# Patient Record
Sex: Female | Born: 1950 | Race: White | Hispanic: No | Marital: Single | Smoking: Former smoker
Health system: Southern US, Community
[De-identification: ages and names within clinical notes are randomized; demographics above are authoritative.]

## PROBLEM LIST (undated history)

## (undated) DIAGNOSIS — F32A Depression, unspecified: Secondary | ICD-10-CM

## (undated) DIAGNOSIS — F329 Major depressive disorder, single episode, unspecified: Secondary | ICD-10-CM

## (undated) DIAGNOSIS — K219 Gastro-esophageal reflux disease without esophagitis: Secondary | ICD-10-CM

## (undated) DIAGNOSIS — L409 Psoriasis, unspecified: Secondary | ICD-10-CM

## (undated) DIAGNOSIS — F419 Anxiety disorder, unspecified: Secondary | ICD-10-CM

## (undated) DIAGNOSIS — I10 Essential (primary) hypertension: Secondary | ICD-10-CM

## (undated) DIAGNOSIS — D649 Anemia, unspecified: Secondary | ICD-10-CM

## (undated) DIAGNOSIS — E7800 Pure hypercholesterolemia, unspecified: Secondary | ICD-10-CM

## (undated) HISTORY — DX: Pure hypercholesterolemia, unspecified: E78.00

## (undated) HISTORY — DX: Major depressive disorder, single episode, unspecified: F32.9

## (undated) HISTORY — DX: Gastro-esophageal reflux disease without esophagitis: K21.9

## (undated) HISTORY — DX: Psoriasis, unspecified: L40.9

## (undated) HISTORY — DX: Essential (primary) hypertension: I10

## (undated) HISTORY — DX: Anemia, unspecified: D64.9

## (undated) HISTORY — DX: Anxiety disorder, unspecified: F41.9

## (undated) HISTORY — DX: Depression, unspecified: F32.A

---

## 1976-04-08 HISTORY — PX: TUBAL LIGATION: SHX77

## 1989-04-08 HISTORY — PX: TOTAL ABDOMINAL HYSTERECTOMY: SHX209

## 1998-08-28 ENCOUNTER — Other Ambulatory Visit: Admission: RE | Admit: 1998-08-28 | Discharge: 1998-08-28 | Payer: Self-pay | Admitting: *Deleted

## 1999-08-23 ENCOUNTER — Encounter: Admission: RE | Admit: 1999-08-23 | Discharge: 1999-08-23 | Payer: Self-pay | Admitting: Obstetrics and Gynecology

## 1999-08-23 ENCOUNTER — Encounter: Payer: Self-pay | Admitting: Obstetrics and Gynecology

## 1999-08-30 ENCOUNTER — Other Ambulatory Visit: Admission: RE | Admit: 1999-08-30 | Discharge: 1999-08-30 | Payer: Self-pay | Admitting: *Deleted

## 2000-08-25 ENCOUNTER — Encounter: Admission: RE | Admit: 2000-08-25 | Discharge: 2000-08-25 | Payer: Self-pay | Admitting: Obstetrics and Gynecology

## 2000-08-25 ENCOUNTER — Encounter: Payer: Self-pay | Admitting: Obstetrics and Gynecology

## 2001-10-14 ENCOUNTER — Encounter: Payer: Self-pay | Admitting: Obstetrics and Gynecology

## 2001-10-14 ENCOUNTER — Encounter: Admission: RE | Admit: 2001-10-14 | Discharge: 2001-10-14 | Payer: Self-pay | Admitting: Obstetrics and Gynecology

## 2003-05-09 ENCOUNTER — Encounter: Admission: RE | Admit: 2003-05-09 | Discharge: 2003-05-09 | Payer: Self-pay | Admitting: Obstetrics and Gynecology

## 2004-06-27 ENCOUNTER — Encounter: Admission: RE | Admit: 2004-06-27 | Discharge: 2004-06-27 | Payer: Self-pay | Admitting: Internal Medicine

## 2004-07-09 ENCOUNTER — Other Ambulatory Visit: Admission: RE | Admit: 2004-07-09 | Discharge: 2004-07-09 | Payer: Self-pay | Admitting: Internal Medicine

## 2004-08-15 ENCOUNTER — Encounter (INDEPENDENT_AMBULATORY_CARE_PROVIDER_SITE_OTHER): Payer: Self-pay | Admitting: Specialist

## 2004-08-15 ENCOUNTER — Ambulatory Visit (HOSPITAL_COMMUNITY): Admission: RE | Admit: 2004-08-15 | Discharge: 2004-08-15 | Payer: Self-pay | Admitting: *Deleted

## 2005-08-05 ENCOUNTER — Ambulatory Visit (HOSPITAL_COMMUNITY): Admission: RE | Admit: 2005-08-05 | Discharge: 2005-08-05 | Payer: Self-pay | Admitting: *Deleted

## 2005-10-03 ENCOUNTER — Encounter: Admission: RE | Admit: 2005-10-03 | Discharge: 2005-10-03 | Payer: Self-pay | Admitting: Internal Medicine

## 2006-01-16 ENCOUNTER — Emergency Department (HOSPITAL_COMMUNITY): Admission: EM | Admit: 2006-01-16 | Discharge: 2006-01-16 | Payer: Self-pay | Admitting: Emergency Medicine

## 2006-04-21 ENCOUNTER — Other Ambulatory Visit: Admission: RE | Admit: 2006-04-21 | Discharge: 2006-04-21 | Payer: Self-pay | Admitting: Obstetrics and Gynecology

## 2006-04-23 ENCOUNTER — Encounter: Admission: RE | Admit: 2006-04-23 | Discharge: 2006-04-23 | Payer: Self-pay | Admitting: Obstetrics and Gynecology

## 2006-11-04 ENCOUNTER — Encounter: Admission: RE | Admit: 2006-11-04 | Discharge: 2006-11-04 | Payer: Self-pay | Admitting: Obstetrics and Gynecology

## 2007-11-05 ENCOUNTER — Encounter: Admission: RE | Admit: 2007-11-05 | Discharge: 2007-11-05 | Payer: Self-pay | Admitting: Internal Medicine

## 2008-05-03 ENCOUNTER — Other Ambulatory Visit: Admission: RE | Admit: 2008-05-03 | Discharge: 2008-05-03 | Payer: Self-pay | Admitting: Obstetrics and Gynecology

## 2008-12-13 ENCOUNTER — Encounter: Admission: RE | Admit: 2008-12-13 | Discharge: 2008-12-13 | Payer: Self-pay | Admitting: Obstetrics and Gynecology

## 2009-05-24 ENCOUNTER — Other Ambulatory Visit: Admission: RE | Admit: 2009-05-24 | Discharge: 2009-05-24 | Payer: Self-pay | Admitting: Obstetrics and Gynecology

## 2010-04-29 ENCOUNTER — Encounter: Payer: Self-pay | Admitting: *Deleted

## 2010-06-15 ENCOUNTER — Other Ambulatory Visit: Payer: Self-pay | Admitting: Internal Medicine

## 2010-06-15 DIAGNOSIS — Z1231 Encounter for screening mammogram for malignant neoplasm of breast: Secondary | ICD-10-CM

## 2010-06-22 ENCOUNTER — Ambulatory Visit
Admission: RE | Admit: 2010-06-22 | Discharge: 2010-06-22 | Disposition: A | Discharge status: Home / Self Care | Payer: 59 | Source: Ambulatory Visit | Attending: Internal Medicine | Admitting: Internal Medicine

## 2010-06-22 DIAGNOSIS — Z1231 Encounter for screening mammogram for malignant neoplasm of breast: Secondary | ICD-10-CM

## 2010-08-24 NOTE — Op Note (Signed)
NAMELAASIA, ARCOS NO.:  [id number]   MEDICAL RECORD NO.:  [id number]          PATIENT TYPE:  AMB   LOCATION:  ENDO                         FACILITY:  [facility]   PHYSICIAN:  Georgiana Spinner, M.D.    DATE OF BIRTH:  [birth year]   DATE OF PROCEDURE:  08/15/2004  DATE OF DISCHARGE:                                 OPERATIVE REPORT   PROCEDURE:  Upper endoscopy with biopsy.   INDICATIONS:  GERD.  The patient states that her dysphagia and reflux have  improved on 2 weeks of PPI.   ANESTHESIA:  Demerol 60, Versed 7 mg.   PROCEDURE:  With patient mildly sedated in the left lateral decubitus  position, the Olympus videoscopic endoscope was inserted in the mouth,  passed under direct vision through the esophagus, which appeared normal,  until we reached the distal esophagus and there was an irregularity of the Z-  line that was photographed and biopsies of this area were taken to rule out  Barrett's esophagus.  We entered into the stomach.  Fundus, body, antrum,  duodenal bulb, second portion duodenum appeared normal.  From this point the  endoscope was slowly withdrawn, taking circumferential views of the duodenal  mucosa until the endoscope had been pulled back and the stomach placed in  retroflexion to review the stomach from below.  The endoscope was  straightened and withdrawn, taking circumferential views of the remaining  gastric and esophageal mucosa.  The patient's vital signs and pulse oximeter  remained stable.  The patient tolerated the procedure well with no apparent  complications.   FINDINGS:  Question of Barrett's esophagus, biopsied; await biopsy report.  Patient will call me for results and followup with me as an outpatient,  continue PPI therapy.      GMO/MEDQ  D:  08/15/2004  T:  08/15/2004  Job:  [id number]

## 2010-08-24 NOTE — Op Note (Signed)
NAMEDAUNE, DIVIRGILIO NO.:  [id number]   MEDICAL RECORD NO.:  [id number]          PATIENT TYPE:  AMB   LOCATION:  ENDO                         FACILITY:  [facility]   PHYSICIAN:  Georgiana Spinner, M.D.    DATE OF BIRTH:  1950/09/14   DATE OF PROCEDURE:  08/05/2005  DATE OF DISCHARGE:                                 OPERATIVE REPORT   PROCEDURE PERFORMED:  Colonoscopy.   INDICATIONS FOR PROCEDURE:  Colon cancer screening.   ANESTHESIA:  Fentanyl 100 mcg, Versed 10 mg.   ENDOSCOPIST:  Georgiana Spinner, M.D.   DESCRIPTION OF PROCEDURE:  With the patient mildly sedated in the left  lateral decubitus position, the Olympus video colonoscope was inserted into  the rectum and passed under direct vision to the cecum identified by  ileocecal valve and appendiceal orifice, both of which were photographed.  From this point the colonoscope was slowly withdrawn, taking circumferential  views of the colonic mucosa stopping only in the rectum which appeared  normal on direct and showed small internal hemorrhoids on retroflex view.  The endoscope was straightened and withdrawn. The patient's vital signs and  pulse oximeter remained stable.  The patient tolerated the procedure well  without apparent complications.   FINDINGS:  Internal hemorrhoids, rare diverticulum of sigmoid colon.  Otherwise unremarkable examination.   PLAN:  Repeat examination in 5 to 10 years.           ______________________________  Georgiana Spinner, M.D.     GMO/MEDQ  D:  08/05/2005  T:  08/05/2005  Job:  [id number]

## 2011-08-06 ENCOUNTER — Other Ambulatory Visit: Payer: Self-pay | Admitting: Obstetrics and Gynecology

## 2011-08-06 DIAGNOSIS — Z1231 Encounter for screening mammogram for malignant neoplasm of breast: Secondary | ICD-10-CM

## 2011-08-13 ENCOUNTER — Ambulatory Visit
Admission: RE | Admit: 2011-08-13 | Discharge: 2011-08-13 | Disposition: A | Discharge status: Home / Self Care | Payer: 59 | Source: Ambulatory Visit | Attending: Obstetrics and Gynecology | Admitting: Obstetrics and Gynecology

## 2011-08-13 DIAGNOSIS — Z1231 Encounter for screening mammogram for malignant neoplasm of breast: Secondary | ICD-10-CM

## 2012-05-09 HISTORY — PX: OTHER SURGICAL HISTORY: SHX169

## 2012-09-30 ENCOUNTER — Other Ambulatory Visit: Payer: Self-pay

## 2012-09-30 DIAGNOSIS — Z1231 Encounter for screening mammogram for malignant neoplasm of breast: Secondary | ICD-10-CM

## 2012-10-28 ENCOUNTER — Ambulatory Visit
Admission: RE | Admit: 2012-10-28 | Discharge: 2012-10-28 | Disposition: A | Discharge status: Home / Self Care | Payer: 59 | Source: Ambulatory Visit

## 2012-10-28 DIAGNOSIS — Z1231 Encounter for screening mammogram for malignant neoplasm of breast: Secondary | ICD-10-CM

## 2012-11-24 ENCOUNTER — Encounter: Payer: Self-pay | Admitting: Obstetrics and Gynecology

## 2012-11-25 ENCOUNTER — Ambulatory Visit: Payer: Self-pay | Admitting: Obstetrics and Gynecology

## 2012-11-25 ENCOUNTER — Telehealth: Payer: Self-pay | Admitting: Obstetrics and Gynecology

## 2012-11-25 NOTE — Telephone Encounter (Signed)
Patient canceled AEX appointment today with Dr. Tresa Res due to an illness. Patient rescheduled to 12/01/12 @ 1:30 with Dr.Romine.

## 2012-12-01 ENCOUNTER — Encounter: Payer: Self-pay | Admitting: Obstetrics and Gynecology

## 2012-12-01 ENCOUNTER — Ambulatory Visit (INDEPENDENT_AMBULATORY_CARE_PROVIDER_SITE_OTHER): Payer: 59 | Admitting: Obstetrics and Gynecology

## 2012-12-01 VITALS — BP 112/62 | HR 68 | Resp 16 | Ht 65.0 in | Wt 233.0 lb

## 2012-12-01 DIAGNOSIS — Z01419 Encounter for gynecological examination (general) (routine) without abnormal findings: Secondary | ICD-10-CM

## 2012-12-01 NOTE — Patient Instructions (Signed)

## 2012-12-01 NOTE — Progress Notes (Signed)
62 y.o.  Divorced   Caucasian   female   G1P1   here for annual exam.    Patient's last menstrual period was 04/08/1989.          Sexually active: no  The current method of family planning is tubal ligation, abstinence and status post hysterectomy.    Exercising: not now, getting ready to start Last mammogram:  10/28/12 normal Last pap smear:2010 neg History of abnormal pap: no Smoking: quit 14 years ago Alcohol: 3-5 glasses of wine a week Last colonoscopy:2006 normal, repeat in 10 years Last Bone Density:2010 normal   Last tetanus shot: 06/01/12 Last cholesterol check: 09/09/12  Elevated controlled with medication  Hgb:  pcp              Urine:pcp   Family History  Problem Relation Age of Onset  . Thyroid disease Mother   . Atrial fibrillation Mother   . Osteoarthritis Mother   . Heart disease Father   . Heart disease Brother   . Breast cancer Maternal Grandmother     There are no active problems to display for this patient.   Past Medical History  Diagnosis Date  . Hypertension   . Psoriasis   . Anxiety   . Depression   . GERD (gastroesophageal reflux disease)   . Elevated cholesterol   . Anemia     Past Surgical History  Procedure Laterality Date  . Total abdominal hysterectomy  1991    DUB-OV retained  . Tubal ligation  1978  . Finger injury Left 05/2012    fore finger cut opened(5 stitches)    Allergies: Penicillins  Current Outpatient Prescriptions  Medication Sig Dispense Refill  . aspirin 81 MG tablet Take 81 mg by mouth daily.      Marland Kitchen atorvastatin (LIPITOR) 40 MG tablet Take 40 mg by mouth daily.      . BUPROPION HCL PO Take 150 mg by mouth daily.      . Calcium Carbonate (CALCIUM 600 PO) Take by mouth 2 (two) times daily.      . citalopram (CELEXA) 20 MG tablet Take 20 mg by mouth daily.      Marland Kitchen EPIPEN 2-PAK 0.3 MG/0.3ML SOAJ injection       . lisinopril (PRINIVIL,ZESTRIL) 20 MG tablet Take 20 mg by mouth daily.      . pantoprazole (PROTONIX) 40 MG  tablet Take 40 mg by mouth daily.      . Multiple Vitamins-Minerals (ONE DAILY WOMENS 50+ PO) Take by mouth daily.       No current facility-administered medications for this visit.    ROS: Pertinent items are noted in HPI.  Social Hx:  Divorced, one daughter, retired, allergic to bee stings  Exam:    BP 112/62  Pulse 68  Resp 16  Ht 5\' 5"  (1.651 m)  Wt 233 lb (105.688 kg)  BMI 38.77 kg/m2  LMP 01/01/1991Ht and Wt stable from last yr   Wt Readings from Last 3 Encounters:  12/01/12 233 lb (105.688 kg)     Ht Readings from Last 3 Encounters:  12/01/12 5\' 5"  (1.651 m)    General appearance: alert, cooperative and appears stated age, diffuse patches of psoriasis over extrem and trunk Head: Normocephalic, without obvious abnormality, atraumatic Neck: no adenopathy, supple, symmetrical, trachea midline and thyroid not enlarged, symmetric, no tenderness/mass/nodules Lungs: clear to auscultation bilaterally Breasts: Inspection negative, No nipple retraction or dimpling, No nipple discharge or bleeding, No axillary or supraclavicular adenopathy, Normal  to palpation without dominant masses Heart: regular rate and rhythm Abdomen: soft, non-tender; bowel sounds normal; no masses,  no organomegaly Extremities: extremities normal, atraumatic, no cyanosis or edema Skin: Skin color, texture, turgor normal. No rashes or lesions Lymph nodes: Cervical, supraclavicular, and axillary nodes normal. No abnormal inguinal nodes palpated Neurologic: Grossly normal   Pelvic: External genitalia:  Diffusely erythematous c/w psoriasis              Urethra:  normal appearing urethra with no masses, tenderness or lesions              Bartholins and Skenes: normal                 Vagina: normal appearing vagina with normal color and discharge, no lesions              Cervix:absent              Pap taken: no        Bimanual Exam:  Uterus:  absent                                      Adnexa: normal  adnexa in size, nontender and no masses                                      Rectovaginal: Confirms                                      Anus:  normal sphincter tone, no lesions  A: normal menopausal exam, no HRT     S/p TAH 1991 for DUB, ovaries retained     Dep/anx;  GERD; elevated chol     Psoriasis, involves vulva and groin     P:     mammogram counseled on breast self exam, mammography screening, adequate intake of calcium and vitamin D, diet and exercise return annually or prn     An After Visit Summary was printed and given to the patient.

## 2013-10-15 ENCOUNTER — Other Ambulatory Visit: Payer: Self-pay

## 2013-10-15 DIAGNOSIS — Z1231 Encounter for screening mammogram for malignant neoplasm of breast: Secondary | ICD-10-CM

## 2013-10-29 ENCOUNTER — Ambulatory Visit
Admission: RE | Admit: 2013-10-29 | Discharge: 2013-10-29 | Disposition: A | Discharge status: Home / Self Care | Payer: 59 | Source: Ambulatory Visit

## 2013-10-29 ENCOUNTER — Encounter (INDEPENDENT_AMBULATORY_CARE_PROVIDER_SITE_OTHER): Payer: Self-pay

## 2013-10-29 DIAGNOSIS — Z1231 Encounter for screening mammogram for malignant neoplasm of breast: Secondary | ICD-10-CM

## 2013-12-02 ENCOUNTER — Ambulatory Visit: Payer: 59 | Admitting: Certified Nurse Midwife

## 2013-12-21 ENCOUNTER — Ambulatory Visit: Payer: 59 | Admitting: Certified Nurse Midwife

## 2014-02-07 ENCOUNTER — Encounter: Payer: Self-pay | Admitting: Obstetrics and Gynecology

## 2014-03-29 ENCOUNTER — Other Ambulatory Visit (HOSPITAL_COMMUNITY): Payer: Self-pay | Admitting: Internal Medicine

## 2014-03-29 ENCOUNTER — Ambulatory Visit (HOSPITAL_COMMUNITY): Payer: 59 | Attending: Internal Medicine | Admitting: Cardiology

## 2014-03-29 DIAGNOSIS — I4949 Other premature depolarization: Secondary | ICD-10-CM

## 2014-03-29 NOTE — Progress Notes (Signed)
Echo performed. 

## 2015-04-14 ENCOUNTER — Other Ambulatory Visit: Payer: Self-pay

## 2015-04-14 DIAGNOSIS — Z1231 Encounter for screening mammogram for malignant neoplasm of breast: Secondary | ICD-10-CM

## 2015-04-26 ENCOUNTER — Ambulatory Visit: Payer: Self-pay

## 2015-05-04 ENCOUNTER — Ambulatory Visit
Admission: RE | Admit: 2015-05-04 | Discharge: 2015-05-04 | Disposition: A | Discharge status: Home / Self Care | Payer: Commercial Managed Care - HMO | Source: Ambulatory Visit

## 2015-05-04 DIAGNOSIS — Z1231 Encounter for screening mammogram for malignant neoplasm of breast: Secondary | ICD-10-CM

## 2015-08-17 ENCOUNTER — Other Ambulatory Visit: Payer: Self-pay | Admitting: Gastroenterology

## 2016-05-02 ENCOUNTER — Other Ambulatory Visit: Payer: Self-pay | Admitting: Obstetrics & Gynecology

## 2016-05-02 DIAGNOSIS — Z1231 Encounter for screening mammogram for malignant neoplasm of breast: Secondary | ICD-10-CM

## 2016-05-30 ENCOUNTER — Ambulatory Visit
Admission: RE | Admit: 2016-05-30 | Discharge: 2016-05-30 | Disposition: A | Discharge status: Home / Self Care | Payer: Medicare Other | Source: Ambulatory Visit | Attending: Obstetrics & Gynecology | Admitting: Obstetrics & Gynecology

## 2016-05-30 DIAGNOSIS — Z1231 Encounter for screening mammogram for malignant neoplasm of breast: Secondary | ICD-10-CM

## 2016-09-04 ENCOUNTER — Other Ambulatory Visit: Payer: Self-pay | Admitting: Internal Medicine

## 2016-09-04 DIAGNOSIS — R3129 Other microscopic hematuria: Secondary | ICD-10-CM

## 2016-09-05 ENCOUNTER — Ambulatory Visit
Admission: RE | Admit: 2016-09-05 | Discharge: 2016-09-05 | Disposition: A | Discharge status: Home / Self Care | Payer: Medicare Other | Source: Ambulatory Visit | Attending: Internal Medicine | Admitting: Internal Medicine

## 2016-09-05 DIAGNOSIS — R3129 Other microscopic hematuria: Secondary | ICD-10-CM

## 2017-05-12 ENCOUNTER — Other Ambulatory Visit: Payer: Self-pay | Admitting: Internal Medicine

## 2017-05-12 ENCOUNTER — Ambulatory Visit
Admission: RE | Admit: 2017-05-12 | Discharge: 2017-05-12 | Disposition: A | Discharge status: Home / Self Care | Payer: Medicare Other | Source: Ambulatory Visit | Attending: Internal Medicine | Admitting: Internal Medicine

## 2017-05-12 DIAGNOSIS — R1030 Lower abdominal pain, unspecified: Secondary | ICD-10-CM

## 2017-06-04 ENCOUNTER — Other Ambulatory Visit: Payer: Self-pay | Admitting: Internal Medicine

## 2017-06-04 DIAGNOSIS — R1030 Lower abdominal pain, unspecified: Secondary | ICD-10-CM

## 2017-06-09 ENCOUNTER — Ambulatory Visit
Admission: RE | Admit: 2017-06-09 | Discharge: 2017-06-09 | Disposition: A | Discharge status: Home / Self Care | Payer: Medicare Other | Source: Ambulatory Visit | Attending: Internal Medicine | Admitting: Internal Medicine

## 2017-06-09 DIAGNOSIS — R1030 Lower abdominal pain, unspecified: Secondary | ICD-10-CM

## 2017-06-09 MED ORDER — IOPAMIDOL (ISOVUE-300) INJECTION 61%
125.0000 mL | Freq: Once | INTRAVENOUS | Status: AC | PRN
  Administered 2017-06-09: 125 mL via INTRAVENOUS

## 2017-06-10 ENCOUNTER — Other Ambulatory Visit: Payer: Self-pay | Admitting: Obstetrics & Gynecology

## 2017-06-10 DIAGNOSIS — Z1231 Encounter for screening mammogram for malignant neoplasm of breast: Secondary | ICD-10-CM

## 2017-06-18 ENCOUNTER — Other Ambulatory Visit: Payer: Medicare Other

## 2017-07-01 ENCOUNTER — Ambulatory Visit
Admission: RE | Admit: 2017-07-01 | Discharge: 2017-07-01 | Disposition: A | Discharge status: Home / Self Care | Payer: Medicare Other | Source: Ambulatory Visit | Attending: Obstetrics & Gynecology | Admitting: Obstetrics & Gynecology

## 2017-07-01 DIAGNOSIS — Z1231 Encounter for screening mammogram for malignant neoplasm of breast: Secondary | ICD-10-CM

## 2017-11-03 ENCOUNTER — Ambulatory Visit: Payer: Medicare Other | Admitting: Neurology

## 2017-11-03 ENCOUNTER — Encounter: Payer: Self-pay | Admitting: Neurology

## 2017-11-03 VITALS — BP 130/88 | HR 92 | Ht 65.0 in | Wt 222.0 lb

## 2017-11-03 DIAGNOSIS — G250 Essential tremor: Secondary | ICD-10-CM | POA: Diagnosis not present

## 2017-11-03 NOTE — Patient Instructions (Signed)
You have a mild tremor of both hands.   I do not see any signs or symptoms of parkinson's like disease or what we call parkinsonism.   For your tremor, I would not recommend any new medication for fear of side effects (especially sleepiness) or medication interactions, especially in light of your other medications and potential interactions.   We do not have to make a follow up appointment.   Please remember, that any kind of tremor may be exacerbated by anxiety, anger, nervousness, excitement, dehydration, sleep deprivation, by caffeine, and low blood sugar values or blood sugar fluctuations. Some medications can exacerbate tremors, this includes antidepressant medications, such as the celexa.    We may consider in the future a medication called Mysoline (primidone) in the future. Common side effects reported are: Sleepiness, drowsiness, balance problems, confusion, and GI related symptoms.

## 2017-11-03 NOTE — Progress Notes (Signed)
Subjective:    [name] is a 67 y.o. female.  HPI     Huston Foley, MD, PhD [facility] [address] [address] [place], [place] [address]  Dear Dr. Renne Crigler,  I saw your patient, Nicole Richmond, upon your kind request in my neurologic clinic today for initial consultation of her tremors. The patient is unaccompanied today. As you know, Nicole Richmond is a 67 year old right-handed woman with an underlying medical history of hypertension, reflux disease, diverticulitis, hyperlipidemia, depression, anxiety, psoriasis, and obesity, who reports a several year history of bilateral upper extremity tremors. In the past few months her tremor has increased. It has been mild and intermittent typically. She has not had any recent changes in her medication regimen. She has been on citalopram and Wellbutrin for years. She takes melatonin at night for sleep and has benefited from it as well. She has no family history of tremor. She denies any balance issues or falls. She does at times have difficulty concentrating and paying attention. She has never had a brain MRI. She tried propranolol in the past but noticed a flareup in her psoriasis and possible interaction with her [place], which has really worked well for her psoriasis. I reviewed your office note from 10/28/2017, which you kindly included. She quit smoking in 2000, she does not drink alcohol on a regular basis, drinks caffeine in the form of coffee, 2 cups per day on average, occasional tea. She is retired. She has 1 grown daughter. She is trying to lose weight.  Her Past Medical History Is Significant For: Past Medical History:  Diagnosis Date  . Anemia   . Anxiety   . Depression   . Elevated cholesterol   . GERD (gastroesophageal reflux disease)   . Hypertension   . Psoriasis     Her Past Surgical History Is Significant For: Past Surgical History:  Procedure Laterality Date  . finger injury  Left 05/2012   fore finger cut opened(5 stitches)  . TOTAL ABDOMINAL HYSTERECTOMY  1991   DUB-OV retained  . TUBAL LIGATION  1978    Her Family History Is Significant For: Family History  Problem Relation Age of Onset  . Thyroid disease Mother   . Atrial fibrillation Mother   . Osteoarthritis Mother   . Heart disease Father   . Heart disease Brother   . Breast cancer Paternal Grandmother        unsure if it was cancer    Her Social History Is Significant For: Social History   Socioeconomic History  . Marital status: Single    Spouse name: Not on file  . Number of children: Not on file  . Years of education: Not on file  . Highest education level: Not on file  Occupational History  . Not on file  Social Needs  . Financial resource strain: Not on file  . Food insecurity:    Worry: Not on file    Inability: Not on file  . Transportation needs:    Medical: Not on file    Non-medical: Not on file  Tobacco Use  . Smoking status: Former Smoker    Last attempt to quit: 12/02/1998    Years since quitting: 18.9  . Smokeless tobacco: Never Used  Substance and Sexual Activity  . Alcohol use: Yes    Alcohol/week: 1.8 - 3.0 oz    Types: 3 - 5 drink(s) per week    Comment: 3-5 glasses of wine a  week  . Drug use: No  . Sexual activity: Never    Partners: Male    Birth control/protection: Surgical    Comment: TAH  Lifestyle  . Physical activity:    Days per week: Not on file    Minutes per session: Not on file  . Stress: Not on file  Relationships  . Social connections:    Talks on phone: Not on file    Gets together: Not on file    Attends religious service: Not on file    Active member of club or organization: Not on file    Attends meetings of clubs or organizations: Not on file    Relationship status: Not on file  Other Topics Concern  . Not on file  Social History Narrative  . Not on file    Her Allergies Are:  Allergies  Allergen Reactions  . Penicillins    :   Her Current Medications Are:  Outpatient Encounter Medications as of 11/03/2017  Medication Sig  . Apremilast (OTEZLA) 30 MG TABS Take 30 mg by mouth 2 (two) times daily.  Marland Kitchen atorvastatin (LIPITOR) 40 MG tablet Take 40 mg by mouth daily.  . BUPROPION HCL PO Take 150 mg by mouth daily.  . Calcium Carb-Cholecalciferol (CALCIUM + D3 PO) Take by mouth.  . citalopram (CELEXA) 20 MG tablet Take 20 mg by mouth daily.  . Cyanocobalamin (B-12 PO) Take 1,000 mcg by mouth daily.  Marland Kitchen EPIPEN 2-PAK 0.3 MG/0.3ML SOAJ injection   . lisinopril (PRINIVIL,ZESTRIL) 20 MG tablet Take 20 mg by mouth daily.  . Melatonin 5 MG TABS Take 5 mg by mouth at bedtime as needed.  . Multiple Vitamins-Minerals (ONE DAILY WOMENS 50+ PO) Take by mouth daily.  . pantoprazole (PROTONIX) 40 MG tablet Take 40 mg by mouth daily.  . Probiotic Product (PROBIOTIC PO) Take by mouth.  . [DISCONTINUED] aspirin 81 MG tablet Take 81 mg by mouth daily.  . [DISCONTINUED] Calcium Carbonate (CALCIUM 600 PO) Take by mouth 2 (two) times daily.  . [DISCONTINUED] ranitidine (ZANTAC) 300 MG tablet Take 300 mg by mouth daily.   No facility-administered encounter medications on file as of 11/03/2017.   : Review of Systems:  Out of a complete 14 point review of systems, all are reviewed and negative with the exception of these symptoms as listed below:  Review of Systems  Neurological:       Pt presents today to discuss her tremors. Pt is right handed but notices the tremors in her left hand mostly.    Objective:  Neurological Exam  Physical Exam Physical Examination:   Vitals:   11/03/17 1319  BP: 130/88  Pulse: 92    General Examination: The patient is a very pleasant 67 y.o. female in no acute distress. She appears well-developed and well-nourished and well groomed.   HEENT: Normocephalic, atraumatic, pupils are equal, round and reactive to light and accommodation. Extraocular tracking is good without limitation to gaze  excursion or nystagmus noted. Normal smooth pursuit is noted. Hearing is grossly intact. Face is symmetric with normal facial animation and normal facial sensation. Speech is clear with no dysarthria noted. There is no hypophonia. There is no lip, neck/head, jaw or voice tremor. Neck is supple with full range of passive and active motion. There are no carotid bruits on auscultation. Oropharynx exam reveals: mild mouth dryness, adequate dental hygiene. Tongue protrudes centrally and palate elevates symmetrically.    Chest: Clear to auscultation without wheezing, rhonchi or crackles  noted.  Heart: S1+S2+0, regular and normal without murmurs, rubs or gallops noted.   Abdomen: Soft, non-tender and non-distended with normal bowel sounds appreciated on auscultation.  Extremities: There is no pitting edema in the distal lower extremities bilaterally.   Skin: Warm and dry without trophic changes noted.  Musculoskeletal: exam reveals no obvious joint deformities, tenderness or joint swelling or erythema.   Neurologically:  Mental status: The patient is awake, alert and oriented in all 4 spheres. Her immediate and remote memory, attention, language skills and fund of knowledge are appropriate. There is no evidence of aphasia, agnosia, apraxia or anomia. Speech is clear with normal prosody and enunciation. Thought process is linear. Mood is normal and affect is normal.  Cranial nerves II - XII are as described above under HEENT exam. In addition: shoulder shrug is normal with equal shoulder height noted. Motor exam: Normal bulk, strength and tone is noted. There is no drift, resting tremor or rebound.   On 11/03/2017: Archimedes spiral drawing she has minimal trembling with the left, slightly better with her dominant, right hand. And writing with her right hand and minimally tremulous, legible, not particularly micrographic. She has a very mild bilateral upper extremity postural and action tremor, no  intention tremor, no resting tremor.   Romberg is negative. Reflexes are 1+ throughout. Fine motor skills and coordination: intact with normal finger taps, normal hand movements, normal rapid alternating patting, normal foot taps and normal foot agility.  Cerebellar testing: No dysmetria or intention tremor on finger to nose testing. Heel to shin is unremarkable bilaterally. There is no truncal or gait ataxia.  Sensory exam: intact to light touch in the upper and lower extremities.  Gait, station and balance: She stands easily. No veering to one side is noted. No leaning to one side is noted. Posture is age-appropriate and stance is narrow based. Gait shows normal stride length and normal pace. No problems turning are noted. Tandem walk is unremarkable.   Assessment and Plan:   In summary, Nicole Richmond is a very pleasant 67 y.o.-year old female with an underlying medical history of hypertension, reflux disease, diverticulitis, hyperlipidemia, depression, anxiety, psoriasis, and obesity, who presents for evaluation of her tremors. On examination she has a mild bilateral upper chimney postural and action tremor. No signs of parkinsonism and she is reassured in that regard. Her history and exam are mostly in keeping with mild essential tremor despite absence of family history thereof. She does take an antidepressant of the SSRI kind which can in some instances cause a tremor or exacerbated. Nevertheless, she has benefited from her antidepressant and anxiety medication and there is no pressing need to change her regimen. Her neurological exam is otherwise benign, no pressing need for a brain scan at this moment. Overall, she is reassured. She tried propranolol in the past which made her psoriasis flareup unfortunately. For future consideration, she may be able to try Mysoline down the Road. For now, we mutually agreed to forego any symptomatic treatments. I will see her back on an as-needed basis. We  talked about tremor triggers today.  I answered all her questions today and she was in agreement.   Thank you very much for allowing me to participate in the care of this nice patient. If I can be of any further assistance to you please do not hesitate to call me at [phone].  Sincerely,   Huston FoleySaima Alera Quevedo, MD, PhD

## 2018-06-17 ENCOUNTER — Other Ambulatory Visit: Payer: Self-pay | Admitting: Obstetrics & Gynecology

## 2018-06-17 DIAGNOSIS — Z1231 Encounter for screening mammogram for malignant neoplasm of breast: Secondary | ICD-10-CM

## 2018-07-15 ENCOUNTER — Ambulatory Visit: Payer: Medicare Other

## 2018-09-02 ENCOUNTER — Ambulatory Visit
Admission: RE | Admit: 2018-09-02 | Discharge: 2018-09-02 | Disposition: A | Discharge status: Home / Self Care | Payer: Medicare Other | Source: Ambulatory Visit | Attending: Obstetrics & Gynecology | Admitting: Obstetrics & Gynecology

## 2018-09-02 ENCOUNTER — Other Ambulatory Visit: Payer: Self-pay

## 2018-09-02 DIAGNOSIS — Z1231 Encounter for screening mammogram for malignant neoplasm of breast: Secondary | ICD-10-CM

## 2019-08-09 ENCOUNTER — Other Ambulatory Visit: Payer: Self-pay | Admitting: Internal Medicine

## 2019-08-09 DIAGNOSIS — Z1231 Encounter for screening mammogram for malignant neoplasm of breast: Secondary | ICD-10-CM

## 2019-09-08 ENCOUNTER — Other Ambulatory Visit: Payer: Self-pay

## 2019-09-08 ENCOUNTER — Ambulatory Visit
Admission: RE | Admit: 2019-09-08 | Discharge: 2019-09-08 | Disposition: A | Discharge status: Home / Self Care | Payer: Medicare Other | Source: Ambulatory Visit | Attending: Internal Medicine | Admitting: Internal Medicine

## 2019-09-08 DIAGNOSIS — Z1231 Encounter for screening mammogram for malignant neoplasm of breast: Secondary | ICD-10-CM

## 2020-03-30 ENCOUNTER — Ambulatory Visit: Payer: Medicare Other | Attending: Critical Care Medicine

## 2020-03-30 ENCOUNTER — Other Ambulatory Visit: Payer: Self-pay

## 2020-03-30 DIAGNOSIS — Z23 Encounter for immunization: Secondary | ICD-10-CM

## 2020-03-30 NOTE — Progress Notes (Signed)
   Covid-19 Vaccination Clinic  Name:  Aziyah Provencal    MRN: [id number] DOB: 29-Mar-1951  03/30/2020  Ms. Leamy was observed post Covid-19 immunization for 15 minutes without incident. She was provided with Vaccine Information Sheet and instruction to access the V-Safe system.   Ms. Johal was instructed to call 911 with any severe reactions post vaccine: Marland Kitchen Difficulty breathing  . Swelling of face and throat  . A fast heartbeat  . A bad rash all over body  . Dizziness and weakness   Immunizations Administered    Name Date Dose VIS Date Route   Pfizer COVID-19 Vaccine 03/30/2020  3:11 PM 0.3 mL 01/26/2020 Intramuscular   Manufacturer: [employer], [employer]   Lot: [id number]   NDC: [address]-5

## 2020-08-14 ENCOUNTER — Other Ambulatory Visit: Payer: Self-pay | Admitting: Internal Medicine

## 2020-08-14 DIAGNOSIS — Z1231 Encounter for screening mammogram for malignant neoplasm of breast: Secondary | ICD-10-CM

## 2020-10-11 ENCOUNTER — Other Ambulatory Visit: Payer: Self-pay

## 2020-10-11 ENCOUNTER — Ambulatory Visit
Admission: RE | Admit: 2020-10-11 | Discharge: 2020-10-11 | Disposition: A | Discharge status: Home / Self Care | Payer: Medicare Other | Source: Ambulatory Visit | Attending: Internal Medicine | Admitting: Internal Medicine

## 2020-10-11 DIAGNOSIS — Z1231 Encounter for screening mammogram for malignant neoplasm of breast: Secondary | ICD-10-CM

## 2020-12-04 ENCOUNTER — Telehealth: Payer: Self-pay | Admitting: Neurology

## 2020-12-04 ENCOUNTER — Encounter: Payer: Self-pay | Admitting: Neurology

## 2020-12-04 ENCOUNTER — Other Ambulatory Visit: Payer: Self-pay

## 2020-12-04 ENCOUNTER — Ambulatory Visit: Payer: Medicare Other | Admitting: Neurology

## 2020-12-04 VITALS — Ht 65.0 in | Wt 219.2 lb

## 2020-12-04 DIAGNOSIS — R6889 Other general symptoms and signs: Secondary | ICD-10-CM | POA: Diagnosis not present

## 2020-12-04 DIAGNOSIS — H532 Diplopia: Secondary | ICD-10-CM

## 2020-12-04 NOTE — Telephone Encounter (Signed)
I sent referral for EEG to [facility] for scheduling.

## 2020-12-04 NOTE — Telephone Encounter (Signed)
[employer] medicare order sent to GI. NPR they will reach out to the patient to schedule.  

## 2020-12-04 NOTE — Progress Notes (Signed)
Subjective:    Patient ID: Breianna Delfino is a 70 y.o. female.  HPI   Star Age, MD, PhD [facility] [address] [place], [place] [address]  Dear Dr. Shelia Media,   I saw your patient, Shaelynn Dragos, upon your kind request in my neurologic clinic today for initial consultation of her double vision and spells of zoning out briefly. The patient is unaccompanied today. As you know, Ms. Probert is a 70 year old right-handed woman with an underlying medical history of anemia, hyperlipidemia, reflux disease, psoriasis, anxiety, depression, hypertension, and obesity, who reports 3 brief spells in the past 6 months of transient double vision and a feeling of zoning out, and emptiness in her head.  She has no history of headaches, she denies any headache or visual aura.  She has not had any spells lasting longer than a few seconds.  She estimates that these last for about 10 seconds at a time.  2 spells happen at home, no obvious trigger and 1 happened when she was driving on the highway which scared her.  She has taken 2 long distance trips by herself recently in the past few months.  Of note, she has not been sleeping well for the past year.  She has been on zaleplon.  She does not take it nightly.  She has not noticed an obvious connection to taking zaleplon.  She has been taking Advil at night and Tylenol during the day for chronic neck pain, she has had neck pain since approximately January of this year.  She is scheduled to see a rheumatologist as she also has psoriasis.  She reports bilateral hip pain and low back pain as well.  Her neck pain does not radiate to one shoulder or 1 arm.  She has not had any injuries or falls or sudden onset of one-sided symptoms such as numbness or tingling or droopy face or slurring of speech.  She has prescription eyeglasses but uses them only for driving.  She uses readers.   I reviewed your office note from 11/28/2020,  she reported 3 episodes of double vision and almost blacked out.  Blood pressure in your office was 120/82, heart rate 68.  She has been referred to ophthalmology. She has an appointment pending with ophthalmology as well as rheumatology.  She denies any chest pain or palpitations but does have shortness of breath with activity.  She has not seen a cardiologist.  She has not had a sleep study. She tries to hydrate well with water, estimates that she drinks at least 40 ounces of water per day.  She drinks caffeine in the form of coffee, half caff, about 1-1/2 cups/day.  She does not drink alcohol on a regular basis.  She lives alone.  She continues to have a hand tremor.  I have evaluated her for hand tremors in the past.  Addendum, 12/04/20 (10:34): I received blood test results from 11/28/2020: ESR mildly elevated at 36, CBC with differential from October 2021 benign, CMP also from October 2021 benign, no recent CMP.  Unless she had an updated CMP, we may have to draw an updated chemistry panel in preparation of her brain MRI.  Previously:   11/03/17: 70 year old right-handed woman with an underlying medical history of hypertension, reflux disease, diverticulitis, hyperlipidemia, depression, anxiety, psoriasis, and obesity, who reports a several year history of bilateral upper extremity tremors. In the past few months her tremor has increased. It has been mild and intermittent  typically. She has not had any recent changes in her medication regimen. She has been on citalopram and Wellbutrin for years. She takes melatonin at night for sleep and has benefited from it as well. She has no family history of tremor. She denies any balance issues or falls. She does at times have difficulty concentrating and paying attention. She has never had a brain MRI. She tried propranolol in the past but noticed a flareup in her psoriasis and possible interaction with her [place], which has really worked well for her psoriasis. I  reviewed your office note from 10/28/2017, which you kindly included. She quit smoking in 2000, she does not drink alcohol on a regular basis, drinks caffeine in the form of coffee, 2 cups per day on average, occasional tea. She is retired. She has 1 grown daughter. She is trying to lose weight. Her Past Medical History Is Significant For: Past Medical History:  Diagnosis Date   Anemia    Anxiety    Depression    Elevated cholesterol    GERD (gastroesophageal reflux disease)    Hypertension    Psoriasis     Her Past Surgical History Is Significant For: Past Surgical History:  Procedure Laterality Date   finger injury Left 05/2012   fore finger cut opened(5 stitches)   TOTAL ABDOMINAL HYSTERECTOMY  1991   DUB-OV retained   TUBAL LIGATION  1978    Her Family History Is Significant For: Family History  Problem Relation Age of Onset   Thyroid disease Mother    Atrial fibrillation Mother    Osteoarthritis Mother    Heart disease Father    Heart disease Brother    Breast cancer Paternal Grandmother        unsure if it was cancer    Her Social History Is Significant For: Social History   Socioeconomic History   Marital status: Single    Spouse name: Not on file   Number of children: Not on file   Years of education: Not on file   Highest education level: Not on file  Occupational History   Not on file  Tobacco Use   Smoking status: Former    Types: Cigarettes    Quit date: 12/02/1998    Years since quitting: 22.0   Smokeless tobacco: Never  Vaping Use   Vaping Use: Never used  Substance and Sexual Activity   Alcohol use: Yes    Alcohol/week: 1.0 standard drink    Types: 1 drink(s) per week    Comment: occ   Drug use: No   Sexual activity: Never    Partners: Male    Birth control/protection: Surgical    Comment: TAH  Other Topics Concern   Not on file  Social History Narrative   Not on file   Social Determinants of Health   Financial Resource Strain: Not  on file  Food Insecurity: Not on file  Transportation Needs: Not on file  Physical Activity: Not on file  Stress: Not on file  Social Connections: Not on file    Her Allergies Are:  Allergies  Allergen Reactions   Penicillins   :   Her Current Medications Are:  Outpatient Encounter Medications as of 12/04/2020  Medication Sig   Apremilast 30 MG TABS Take 30 mg by mouth 2 (two) times daily.   atorvastatin (LIPITOR) 40 MG tablet Take 40 mg by mouth daily.   BUPROPION HCL PO Take 150 mg by mouth daily.   Calcium Carb-Cholecalciferol (CALCIUM +  D3 PO) Take by mouth.   citalopram (CELEXA) 20 MG tablet Take 20 mg by mouth daily.   EPIPEN 2-PAK 0.3 MG/0.3ML SOAJ injection    LORATADINE ALLERGY RELIEF PO Take by mouth.   Melatonin 5 MG TABS Take 5 mg by mouth at bedtime as needed.   Multiple Vitamins-Minerals (ONE DAILY WOMENS 50+ PO) Take by mouth daily.   pantoprazole (PROTONIX) 40 MG tablet Take 40 mg by mouth daily.   Probiotic Product (PROBIOTIC PO) Take by mouth.   zaleplon (SONATA) 10 MG capsule Take 10 mg by mouth at bedtime as needed for sleep.   Cyanocobalamin (B-12 PO) Take 1,000 mcg by mouth daily.   lisinopril (PRINIVIL,ZESTRIL) 20 MG tablet Take 20 mg by mouth daily.   No facility-administered encounter medications on file as of 12/04/2020.  :   Review of Systems:  Out of a complete 14 point review of systems, all are reviewed and negative with the exception of these symptoms as listed below:  Review of Systems  Neurological:        Pt is here states that she is having double vision pt states she spaced out while she was driving . Pt stated she has had 3 episodes in the past 3 months . Pt states she has had severe neck pain since Jan. Pt states she is taking tylenol and advil daily    Objective:  Neurological Exam  Physical Exam Physical Examination:   There were no vitals filed for this visit.  See orthostatic vitals.  She did not have any obvious orthostatic  systolic or diastolic blood pressure drop, no orthostatic symptoms, no vertiginous symptoms.  General Examination: The patient is a very pleasant 70 y.o. female in no acute distress. She appears well-developed and well-nourished and well groomed.   HEENT: Normocephalic, atraumatic, pupils are equal, round and reactive to light and accommodation. Extraocular tracking is good without limitation to gaze excursion or nystagmus noted. Normal smooth pursuit is noted.  Funduscopic exam is somewhat limited due to bilateral cataracts.  She denies any diplopia.  Visual fields are full by finger perimetry. Hearing is grossly intact with tuning fork testing.  Face is symmetric with normal facial animation and normal facial sensation. Speech is clear with no dysarthria noted. There is no hypophonia. There is no lip, neck/head, jaw or voice tremor. Neck is supple with mild limitation to side to side turn, no crepitations noted. There are no carotid bruits on auscultation. Oropharynx exam reveals: mild mouth dryness, adequate dental hygiene. Tongue protrudes centrally and palate elevates symmetrically.     Chest: Clear to auscultation without wheezing, rhonchi or crackles noted.   Heart: S1+S2+0, regular and normal without murmurs, rubs or gallops noted.    Abdomen: Soft, non-tender and non-distended.   Extremities: There is no pitting edema in the distal lower extremities bilaterally.    Skin: Warm and dry without trophic changes noted.   Musculoskeletal: exam reveals no obvious joint deformities.    Neurologically:  Mental status: The patient is awake, alert and oriented in all 4 spheres. Her immediate and remote memory, attention, language skills and fund of knowledge are appropriate. There is no evidence of aphasia, agnosia, apraxia or anomia. Speech is clear with normal prosody and enunciation. Thought process is linear. Mood is normal and affect is normal.  Cranial nerves II - XII are as described above  under HEENT exam.  Motor exam: Normal bulk, strength and tone is noted. There is no drift, resting tremor or rebound.  She has a mild bilateral upper extremity postural and action tremor, no intention tremor, no resting tremor.    Romberg is negative. Reflexes are 1+ throughout, including ankles, toes are downgoing bilaterally. Fine motor skills and coordination: intact with normal finger taps, normal hand movements, normal rapid alternating patting, normal foot taps and normal foot agility.  Cerebellar testing: No dysmetria or intention tremor on finger to nose testing. Heel to shin is unremarkable bilaterally. There is no truncal or gait ataxia.  Sensory exam: intact to light touch in the upper and lower extremities.  Gait, station and balance: She stands easily. No veering to one side is noted. No leaning to one side is noted. Posture is age-appropriate and stance is narrow based. Gait shows normal stride length and normal pace. No problems turning are noted. Tandem walk is slow but doable, no corrective steps.      Assessment and Plan:    In summary, RONETTA MOLLA is a very pleasant 70 year old female with an underlying medical history of anemia, hyperlipidemia, reflux disease, psoriasis, anxiety, depression, hypertension, and obesity, who presents for evaluation of her brief episodes of diplopia and feeling of losing touch with reality briefly, lasting approximately 10 seconds at a time.  She has had 3 such episodes within the past 6 months.  On examination, she has a nonfocal neurological exam, stable upper extremity tremors.  No signs of parkinsonism.  History is not suggestive of TIA given the brief and stereotypic presentation.  History also not classic for seizure activity.  She is advised to seek consultation with rheumatology and ophthalmology as planned.  I would like to proceed with a brain MRI with and without contrast.  We will also proceed with an EEG for completion.  We will keep  her posted as to her results by phone call for now.  She is encouraged to talk to you about evaluation through cardiology.  Furthermore, she is advised to consider evaluation for sleep apnea but she is encouraged to discuss this with you first.  She reports that she has been able to sleep well until about a year ago when she started having trouble going to sleep and staying asleep.  She is furthermore advised to stop Sonata for now.  Medications such as benzodiazepine receptor agonists can cause daytime symptoms including grogginess, spells of decreased attentiveness, rarely also behaviors without recollection and brief episodes of amnesia.  She is advised to stay well-hydrated and well rested.  She is advised to refrain from long distance and highway driving for now.  She is advised to follow-up with you routinely and also discuss restarting Sonata if need be in the near future but for now I would recommend that she monitor her symptoms off of this medication.  I answered all her questions today and she was in agreement with the plan.   Thank you very much for allowing me to participate in the care of this nice patient. If I can be of any further assistance to you please do not hesitate to call me at [phone].   Sincerely,     Star Age, MD, PhD

## 2020-12-04 NOTE — Patient Instructions (Signed)
As discussed, your symptoms are not classic for a mini stroke so what we call TIA.  Nevertheless, I agree with further evaluation and from my end of things would like to proceed with a brain MRI with and without contrast as well as an EEG which is a brain electrical test.  We will call you to schedule these tests and keep you posted as to the results either through MyChart messaging or by phone call.  Your neurological exam looks benign today which is reassuring.  For your neck pain, I think it is a good idea to see a rheumatologist and you may benefit from seeing a spine specialist next.  If you have not seen a cardiologist, I recommend that you talk to your primary care about a referral to a cardiologist.  I think it is a good idea for you to see an ophthalmologist, you also have cataracts, these may need attention in the near future.  For now, I would recommend you do not drive long distance or on the highway.  Also, please refrain from taking the zaleplon for sleep, it can cause symptoms during the day including lapses in memory and spells of grogginess.  Please also talk to your primary care physician about potentially pursuing a sleep study as you do not sleep well at night.

## 2020-12-22 ENCOUNTER — Ambulatory Visit
Admission: RE | Admit: 2020-12-22 | Discharge: 2020-12-22 | Disposition: A | Discharge status: Home / Self Care | Payer: Medicare Other | Source: Ambulatory Visit | Attending: Neurology | Admitting: Neurology

## 2020-12-22 ENCOUNTER — Other Ambulatory Visit: Payer: Self-pay

## 2020-12-22 DIAGNOSIS — H532 Diplopia: Secondary | ICD-10-CM

## 2020-12-22 DIAGNOSIS — R6889 Other general symptoms and signs: Secondary | ICD-10-CM | POA: Diagnosis not present

## 2020-12-22 MED ORDER — GADOBENATE DIMEGLUMINE 529 MG/ML IV SOLN
20.0000 mL | Freq: Once | INTRAVENOUS | Status: AC | PRN
  Administered 2020-12-22: 20 mL via INTRAVENOUS

## 2020-12-26 ENCOUNTER — Telehealth: Payer: Self-pay | Admitting: *Deleted

## 2020-12-26 NOTE — Telephone Encounter (Signed)
Spoke with the patient and discussed the MRI brain results as noted in detail by Dr. Frances Furbish.  Patient understanding and her questions were answered.  At first she stated she would prefer not to have her EEG at [place]. To honor her wishes I advised we would look into other options as [facility] is not currently able to do the EEG. When I called the patient back to discuss alternative options she advised she changed her mind and she will proceed with having the EEG at [facility]. I clarified with patient with patient and she said she would have it there and she didn't know why she had been concerned. Her questions about MRI/EEG were answered and she verbalized appreciation for the call.

## 2020-12-26 NOTE — Telephone Encounter (Signed)
-----   Message from Huston Foley, MD sent at 12/25/2020 11:38 AM EDT ----- Please call patient regarding the recent brain MRI: The brain scan showed a normal structure of the brain and no significant volume loss (which we call atrophy). There were changes in the deeper structures of the brain, which we call white matter changes or microvascular changes. These were reported as moderately advanced in her case. These are white spots, that occur with time and are seen in a variety of conditions, including with normal aging, chronic hypertension, chronic headaches, especially migraine HAs, chronic diabetes, chronic hyperlipidemia. These are not strokes and no mass or lesion or contrast enhancement was seen which is reassuring. Again, there were no acute findings, such as a stroke, or mass or blood products. No further action is required on this test at this time, other than re-enforcing the importance of good blood pressure control, good cholesterol control, good blood sugar control, and weight management. Please remind patient to keep any upcoming appointments or tests and to call us with any interim questions, concerns, problems or updates.   I am not sure if she is scheduled for her EEG yet, can you look into it? Thanks,  Huston Foley, MD, PhD

## 2021-01-11 ENCOUNTER — Other Ambulatory Visit: Payer: Self-pay

## 2021-01-11 ENCOUNTER — Ambulatory Visit (HOSPITAL_COMMUNITY)
Admission: RE | Admit: 2021-01-11 | Discharge: 2021-01-11 | Disposition: A | Discharge status: Home / Self Care | Payer: Medicare Other | Source: Ambulatory Visit | Attending: Neurology | Admitting: Neurology

## 2021-01-11 DIAGNOSIS — H532 Diplopia: Secondary | ICD-10-CM | POA: Diagnosis not present

## 2021-01-11 DIAGNOSIS — R6889 Other general symptoms and signs: Secondary | ICD-10-CM | POA: Insufficient documentation

## 2021-01-11 DIAGNOSIS — R410 Disorientation, unspecified: Secondary | ICD-10-CM | POA: Diagnosis not present

## 2021-01-11 NOTE — Progress Notes (Signed)
EEG Completed; Results Pending  

## 2021-01-11 NOTE — Procedures (Signed)
  History:  79 yof with brief episode of diplopia and feeling of losing touch with reality   EEG classification: Normal awake and drowsy  Description of the recording: The background rhythms of this recording consists of a fairly well modulated medium amplitude alpha rhythm of 9 Hz that is reactive to eye opening and closure. As the record progresses, the patient appears to remain in the waking state throughout the recording. Photic stimulation was performed, did not show any abnormalities. Hyperventilation was also performed, did not show any abnormalities. Toward the end of the recording, the patient enters the drowsy state with slight symmetric slowing seen. The patient never enters stage II sleep. No abnormal epileptiform discharges seen during this recording. There was no focal slowing. EKG monitor shows no evidence of cardiac rhythm abnormalities with a heart rate of 66.  Impression: This is a normal EEG recording in the waking and drowsy state. No evidence of interictal epileptiform discharges seen. A normal EEG does not exclude a diagnosis of epilepsy.    Windell Norfolk, MD [facility]

## 2021-01-15 ENCOUNTER — Telehealth: Payer: Self-pay | Admitting: *Deleted

## 2021-01-15 NOTE — Telephone Encounter (Signed)
Spoke with patient and advised her of EEG results and recommendations as noted below by Dr Frances Furbish. Pt verbalized understanding and her questions were answered. Pt aware result will be sent over to Dr Renne Crigler as Dr Frances Furbish has recommended at this juncture that patient f/u with her PCP and other specialists as scheduled. She verbalized appreciation for the call.

## 2021-01-15 NOTE — Telephone Encounter (Signed)
-----   Message from Huston Foley, MD sent at 01/11/2021  2:12 PM EDT ----- Please call and advise the patient that the EEG or brain wave test we performed was reported as normal in the awake and drowsy states. We checked for abnormal electrical discharges in the brain waves and the report suggested normal findings. No further action is required on this test at this time.   At this juncture, I recommend that she follow-up with her primary care physician and other specialists as scheduled.  Thanks,  Huston Foley, MD, PhD

## 2021-04-19 ENCOUNTER — Other Ambulatory Visit: Payer: Self-pay | Admitting: Registered Nurse

## 2021-04-19 DIAGNOSIS — N6331 Unspecified lump in axillary tail of the right breast: Secondary | ICD-10-CM

## 2021-05-23 ENCOUNTER — Ambulatory Visit
Admission: RE | Admit: 2021-05-23 | Discharge: 2021-05-23 | Disposition: A | Discharge status: Home / Self Care | Payer: Medicare Other | Source: Ambulatory Visit | Attending: Registered Nurse | Admitting: Registered Nurse

## 2021-05-23 DIAGNOSIS — N6331 Unspecified lump in axillary tail of the right breast: Secondary | ICD-10-CM

## 2022-01-23 ENCOUNTER — Other Ambulatory Visit: Payer: Self-pay | Admitting: Internal Medicine

## 2022-01-23 DIAGNOSIS — Z1231 Encounter for screening mammogram for malignant neoplasm of breast: Secondary | ICD-10-CM

## 2022-03-14 ENCOUNTER — Ambulatory Visit
Admission: RE | Admit: 2022-03-14 | Discharge: 2022-03-14 | Disposition: A | Discharge status: Home / Self Care | Payer: Medicare Other | Source: Ambulatory Visit | Attending: Internal Medicine | Admitting: Internal Medicine

## 2022-03-14 DIAGNOSIS — Z1231 Encounter for screening mammogram for malignant neoplasm of breast: Secondary | ICD-10-CM

## 2022-04-11 ENCOUNTER — Other Ambulatory Visit (HOSPITAL_COMMUNITY): Payer: Self-pay | Admitting: Internal Medicine

## 2022-04-11 DIAGNOSIS — Z0000 Encounter for general adult medical examination without abnormal findings: Secondary | ICD-10-CM

## 2022-07-20 ENCOUNTER — Emergency Department (HOSPITAL_COMMUNITY): Payer: Medicare Other

## 2022-07-20 ENCOUNTER — Encounter (HOSPITAL_COMMUNITY): Payer: Self-pay

## 2022-07-20 ENCOUNTER — Other Ambulatory Visit: Payer: Self-pay

## 2022-07-20 ENCOUNTER — Observation Stay (HOSPITAL_COMMUNITY)
Admission: EM | Admit: 2022-07-20 | Discharge: 2022-07-21 | Disposition: A | Discharge status: Home / Self Care | Payer: Medicare Other | Attending: Internal Medicine | Admitting: Internal Medicine

## 2022-07-20 DIAGNOSIS — Z87891 Personal history of nicotine dependence: Secondary | ICD-10-CM | POA: Diagnosis not present

## 2022-07-20 DIAGNOSIS — F32A Depression, unspecified: Secondary | ICD-10-CM | POA: Diagnosis not present

## 2022-07-20 DIAGNOSIS — I160 Hypertensive urgency: Secondary | ICD-10-CM | POA: Diagnosis not present

## 2022-07-20 DIAGNOSIS — R0789 Other chest pain: Principal | ICD-10-CM | POA: Insufficient documentation

## 2022-07-20 DIAGNOSIS — E669 Obesity, unspecified: Secondary | ICD-10-CM | POA: Insufficient documentation

## 2022-07-20 DIAGNOSIS — I10 Essential (primary) hypertension: Secondary | ICD-10-CM | POA: Diagnosis not present

## 2022-07-20 DIAGNOSIS — F419 Anxiety disorder, unspecified: Secondary | ICD-10-CM | POA: Diagnosis not present

## 2022-07-20 DIAGNOSIS — L409 Psoriasis, unspecified: Secondary | ICD-10-CM | POA: Insufficient documentation

## 2022-07-20 DIAGNOSIS — Z6833 Body mass index (BMI) 33.0-33.9, adult: Secondary | ICD-10-CM | POA: Diagnosis not present

## 2022-07-20 DIAGNOSIS — I209 Angina pectoris, unspecified: Secondary | ICD-10-CM

## 2022-07-20 DIAGNOSIS — Z79899 Other long term (current) drug therapy: Secondary | ICD-10-CM | POA: Diagnosis not present

## 2022-07-20 DIAGNOSIS — K219 Gastro-esophageal reflux disease without esophagitis: Secondary | ICD-10-CM | POA: Insufficient documentation

## 2022-07-20 DIAGNOSIS — R079 Chest pain, unspecified: Secondary | ICD-10-CM | POA: Diagnosis present

## 2022-07-20 DIAGNOSIS — E785 Hyperlipidemia, unspecified: Secondary | ICD-10-CM | POA: Insufficient documentation

## 2022-07-20 LAB — COMPREHENSIVE METABOLIC PANEL
ALT: 22 U/L (ref 0–44)
AST: 22 U/L (ref 15–41)
Albumin: 3.8 g/dL (ref 3.5–5.0)
Alkaline Phosphatase: 117 U/L (ref 38–126)
Anion gap: 10 (ref 5–15)
BUN: 11 mg/dL (ref 8–23)
CO2: 25 mmol/L (ref 22–32)
Calcium: 9.4 mg/dL (ref 8.9–10.3)
Chloride: 102 mmol/L (ref 98–111)
Creatinine, Ser: 0.81 mg/dL (ref 0.44–1.00)
GFR, Estimated: >60 mL/min (ref >60)
Glucose, Bld: 97 mg/dL (ref 70–99)
Potassium: 4 mmol/L (ref 3.5–5.1)
Sodium: 137 mmol/L (ref 135–145)
Total Bilirubin: 1 mg/dL (ref 0.3–1.2)
Total Protein: 7.5 g/dL (ref 6.5–8.1)

## 2022-07-20 LAB — TROPONIN I (HIGH SENSITIVITY)
Troponin I (High Sensitivity): 4 ng/L (ref <18)
Troponin I (High Sensitivity): 5 ng/L (ref <18)
Troponin I (High Sensitivity): 4 ng/L (ref <18)

## 2022-07-20 LAB — CBC WITH DIFFERENTIAL/PLATELET
Abs Immature Granulocytes: 0.03 10*3/uL (ref 0.00–0.07)
Basophils Absolute: 0.1 10*3/uL (ref 0.0–0.1)
Basophils Relative: 1 %
Eosinophils Absolute: 0.2 10*3/uL (ref 0.0–0.5)
Eosinophils Relative: 2 %
HCT: 39.9 % (ref 36.0–46.0)
Hemoglobin: 12.3 g/dL (ref 12.0–15.0)
Immature Granulocytes: 0 %
Lymphocytes Relative: 20 %
Lymphs Abs: 2.2 10*3/uL (ref 0.7–4.0)
MCH: 26.8 pg (ref 26.0–34.0)
MCHC: 30.8 g/dL (ref 30.0–36.0)
MCV: 86.9 fL (ref 80.0–100.0)
Monocytes Absolute: 1.1 10*3/uL — ABNORMAL HIGH (ref 0.1–1.0)
Monocytes Relative: 10 %
Neutro Abs: 7.3 10*3/uL (ref 1.7–7.7)
Neutrophils Relative %: 67 %
Platelets: 346 10*3/uL (ref 150–400)
RBC: 4.59 MIL/uL (ref 3.87–5.11)
RDW: 13.6 % (ref 11.5–15.5)
WBC: 10.9 10*3/uL — ABNORMAL HIGH (ref 4.0–10.5)
nRBC: 0 % (ref 0.0–0.2)

## 2022-07-20 LAB — D-DIMER, QUANTITATIVE: D-Dimer, Quant: <0.27 ug/mL-FEU (ref 0.00–0.50)

## 2022-07-20 LAB — PROTIME-INR
INR: 1 (ref 0.8–1.2)
Prothrombin Time: 13.4 seconds (ref 11.4–15.2)

## 2022-07-20 MED ORDER — MELATONIN 5 MG PO TABS: 5.0000 mg | ORAL_TABLET | Freq: Every evening | ORAL | Status: DC | PRN

## 2022-07-20 MED ORDER — PANTOPRAZOLE SODIUM 40 MG PO TBEC
40.0000 mg | DELAYED_RELEASE_TABLET | Freq: Every day | ORAL | Status: DC
  Administered 2022-07-21: 40 mg via ORAL
  Filled 2022-07-20: qty 1

## 2022-07-20 MED ORDER — NITROGLYCERIN 0.4 MG SL SUBL: 0.4000 mg | SUBLINGUAL_TABLET | SUBLINGUAL | Status: DC | PRN

## 2022-07-20 MED ORDER — ATORVASTATIN CALCIUM 40 MG PO TABS
40.0000 mg | ORAL_TABLET | Freq: Every day | ORAL | Status: DC
  Administered 2022-07-21: 40 mg via ORAL
  Filled 2022-07-20: qty 1

## 2022-07-20 MED ORDER — ACETAMINOPHEN 325 MG PO TABS: 650.0000 mg | ORAL_TABLET | ORAL | Status: DC | PRN

## 2022-07-20 MED ORDER — CITALOPRAM HYDROBROMIDE 10 MG PO TABS
20.0000 mg | ORAL_TABLET | Freq: Every day | ORAL | Status: DC
  Administered 2022-07-21: 20 mg via ORAL
  Filled 2022-07-20: qty 2

## 2022-07-20 MED ORDER — BUPROPION HCL ER (XL) 150 MG PO TB24
150.0000 mg | ORAL_TABLET | Freq: Every day | ORAL | Status: DC
  Administered 2022-07-21: 150 mg via ORAL
  Filled 2022-07-20: qty 1

## 2022-07-20 MED ORDER — ONDANSETRON HCL 4 MG/2ML IJ SOLN: 4.0000 mg | Freq: Four times a day (QID) | INTRAMUSCULAR | Status: DC | PRN

## 2022-07-20 MED ORDER — AMLODIPINE BESYLATE 10 MG PO TABS
10.0000 mg | ORAL_TABLET | Freq: Every day | ORAL | Status: DC
  Administered 2022-07-21: 10 mg via ORAL
  Filled 2022-07-20: qty 1

## 2022-07-20 MED ORDER — HEPARIN (PORCINE) 25000 UT/250ML-% IV SOLN
1050.0000 [IU]/h | INTRAVENOUS | Status: DC
  Administered 2022-07-20: 900 [IU]/h via INTRAVENOUS
  Filled 2022-07-20: qty 250

## 2022-07-20 MED ORDER — APREMILAST 30 MG PO TABS: 30.0000 mg | ORAL_TABLET | Freq: Two times a day (BID) | ORAL | Status: DC

## 2022-07-20 MED ORDER — HEPARIN BOLUS VIA INFUSION
4000.0000 [IU] | Freq: Once | INTRAVENOUS | Status: AC
  Administered 2022-07-20: 4000 [IU] via INTRAVENOUS
  Filled 2022-07-20: qty 4000

## 2022-07-20 MED ORDER — NITROGLYCERIN 0.4 MG SL SUBL: 0.4000 mg | SUBLINGUAL_TABLET | Freq: Once | SUBLINGUAL | Status: DC

## 2022-07-20 MED ORDER — ZOLPIDEM TARTRATE 5 MG PO TABS: 5.0000 mg | ORAL_TABLET | Freq: Every evening | ORAL | Status: DC | PRN

## 2022-07-20 NOTE — H&P (Addendum)
History and Physical   Nicole Richmond SLH:[id number] DOB: 1950/10/06 DOA: 07/20/2022  PCP: Merri Brunette, MD   Patient coming from: Home  Chief Complaint: Chest pain   HPI: Nicole Richmond is a 72 y.o. female with medical history significant of anemia, anxiety, depression, GERD, hypertension, hyperlipidemia, psoriasis presenting with chest pain.  Patient awoked from sleep with severe chest pain/pressure radiation to her neck.  Reports pain for several hours.  Denies any associated nausea, diaphoresis, shortness of breath.  Does report a dark stool week ago which she attributes to taking ibuprofen for pain.  Called EMS and pain improved somewhat with aspirin and route.  2 out of 10 in the ED. Now increased to 8-9/10 on my exam  Denies fevers, chills, abdominal pain, constipation, diarrhea, nausea, vomiting.  Reports family history significant for both her brother and father having heart attacks at less than 74 years of age.  ED Course: Vital signs in the ED notable for blood pressure in the 90s to 120s systolic.  Lab workup included CMP within normal limits.  CBC with mild leukocytosis to 10.9.  PT, PTT, INR within normal limits.  Troponin negative x 2.  D-dimer normal.  FOBT pending.  Chest x-ray showed no acute normality.  Cardiology consulted and recommended prophylactic heparin drip and echocardiogram with monitoring overnight, will see the patient.  Review of Systems: As per HPI otherwise all other systems reviewed and are negative.  Past Medical History:  Diagnosis Date   Anemia    Anxiety    Depression    Elevated cholesterol    GERD (gastroesophageal reflux disease)    Hypertension    Psoriasis     Past Surgical History:  Procedure Laterality Date   finger injury Left 05/2012   fore finger cut opened(5 stitches)   TOTAL ABDOMINAL HYSTERECTOMY  1991   DUB-OV retained   TUBAL LIGATION  1978    Social History  reports that she quit smoking about 23 years ago.  Her smoking use included cigarettes. She has never used smokeless tobacco. She reports current alcohol use of about 1.0 standard drink of alcohol per week. She reports that she does not use drugs.  Allergies  Allergen Reactions   Penicillins     Family History  Problem Relation Age of Onset   Thyroid disease Mother    Atrial fibrillation Mother    Osteoarthritis Mother    Heart disease Father    Heart disease Brother    Breast cancer Paternal Grandmother        unsure if it was cancer  Reviewed on admission  Prior to Admission medications   Medication Sig Start Date End Date Taking? Authorizing Provider  Apremilast 30 MG TABS Take 30 mg by mouth 2 (two) times daily.    [provider]  atorvastatin (LIPITOR) 40 MG tablet Take 40 mg by mouth daily.    [provider]  BUPROPION HCL PO Take 150 mg by mouth daily.    [provider]  Calcium Carb-Cholecalciferol (CALCIUM + D3 PO) Take by mouth.    [provider]  citalopram (CELEXA) 20 MG tablet Take 20 mg by mouth daily.    [provider]  Cyanocobalamin (B-12 PO) Take 1,000 mcg by mouth daily.    [provider]  EPIPEN 2-PAK 0.3 MG/0.3ML SOAJ injection  09/17/12   [provider]  lisinopril (PRINIVIL,ZESTRIL) 20 MG tablet Take 20 mg by mouth daily.    [provider]  LORATADINE  ALLERGY RELIEF PO Take by mouth.    [provider]  Melatonin 5 MG TABS Take 5 mg by mouth at bedtime as needed.    [provider]  Multiple Vitamins-Minerals (ONE DAILY WOMENS 50+ PO) Take by mouth daily.    [provider]  pantoprazole (PROTONIX) 40 MG tablet Take 40 mg by mouth daily.    [provider]  Probiotic Product (PROBIOTIC PO) Take by mouth.    [provider]  zaleplon (SONATA) 10 MG capsule Take 10 mg by mouth at bedtime as needed for sleep.    [provider]    Physical Exam: Vitals:   07/20/22 1800 07/20/22  1812 07/20/22 1845 07/20/22 1848  BP:   134/79   Pulse: 85 87 83   Resp: Temp:    98.3 F (36.8 C)  TempSrc:    Oral  SpO2: 99% 99% 100%     Physical Exam Constitutional:      General: She is not in acute distress.    Appearance: Normal appearance. She is obese.  HENT:     Head: Normocephalic and atraumatic.     Mouth/Throat:     Mouth: Mucous membranes are moist.     Pharynx: Oropharynx is clear.  Eyes:     Extraocular Movements: Extraocular movements intact.     Pupils: Pupils are equal, round, and reactive to light.  Cardiovascular:     Rate and Rhythm: Normal rate and regular rhythm.     Pulses: Normal pulses.     Heart sounds: Murmur heard.  Pulmonary:     Effort: Pulmonary effort is normal. No respiratory distress.     Breath sounds: Normal breath sounds.  Abdominal:     General: Bowel sounds are normal. There is no distension.     Palpations: Abdomen is soft.     Tenderness: There is no abdominal tenderness.  Musculoskeletal:        General: No swelling or deformity.  Skin:    General: Skin is warm and dry.  Neurological:     General: No focal deficit present.     Mental Status: Mental status is at baseline.    Labs on Admission: I have personally reviewed following labs and imaging studies  CBC: Recent Labs  Lab 07/20/22 1621  WBC 10.9*  NEUTROABS 7.3  HGB 12.3  HCT 39.9  MCV 86.9  PLT 346    Basic Metabolic Panel: Recent Labs  Lab 07/20/22 1621  NA 137  K 4.0  CL 102  CO2 25  GLUCOSE 97  BUN 11  CREATININE 0.81  CALCIUM 9.4    GFR: CrCl cannot be calculated (Unknown ideal weight.).  Liver Function Tests: Recent Labs  Lab 07/20/22 1621  AST 22  ALT 22  ALKPHOS 117  BILITOT 1.0  PROT 7.5  ALBUMIN 3.8    Urine analysis: No results found for: "COLORURINE", "APPEARANCEUR", "LABSPEC", "PHURINE", "GLUCOSEU", "HGBUR", "BILIRUBINUR", "KETONESUR", "PROTEINUR", "UROBILINOGEN", "NITRITE", "LEUKOCYTESUR"  Radiological  Exams on Admission: DG Chest Portable 1 View  Result Date: 07/20/2022 CLINICAL DATA:  Chest pain and pressure. EXAM: PORTABLE CHEST 1 VIEW COMPARISON:  01/16/2006 FINDINGS: The heart size and mediastinal contours are within normal limits. Both lungs are clear. The visualized skeletal structures are unremarkable. IMPRESSION: No active disease. Electronically Signed   By: Danae Orleans M.D.   On: 07/20/2022 16:48    EKG: Independently reviewed.  Sinus rhythm at 83 bpm.  Low voltage multiple leads.  Nonspecific T wave flattening.  Assessment/Plan Principal Problem:   Chest pain, rule out acute myocardial infarction Active Problems:   HTN (hypertension)   HLD (hyperlipidemia)   GERD (gastroesophageal reflux disease)   Anxiety   Depression   Psoriasis   Chest pain, rule out ACS > and radiating to her neck. Patient presenting with chest pain that awoke her from sleep > Somewhat improved with aspirin given by EMS.  No nitroglycerin thus far. > No diaphoresis, nausea, shortness of breath. > Does have personal history of hypertension and hyperlipidemia with family history of MI at less than 24 years of age for her brother and father. > Cardiology consulted in the ED recommending observation, heparin, echo. > Had dark stool recently, hemoglobin stable, but FOBT ordered given patient started on heparin drip. - Monitor on telemetry - Appreciate cardiology recommendations - Echocardiogram - EKG as needed - Heparin drip  - Increasing chest pain when seen: nitro, repeat EKG, repeat troponin  Hypertension - Continue home amlodipine  Hyperlipidemia - Continue home atorvastatin  Anxiety Depression - Continue home Celexa and bupropion - Continue home zaleplon  GERD - Continue PPI  Psoriasis - Continue home apremilast  DVT prophylaxis: Heparin Code Status:   Full  Family Communication:  Updated at bedside Disposition Plan:   Patient is from:  Home  Anticipated DC  to:  Home  Anticipated DC date:  1 to 2 days  Anticipated DC barriers: None  Consults called:  Cardiology Admission status:  Observation, telemetry  Severity of Illness: The appropriate patient status for this patient is OBSERVATION. Observation status is judged to be reasonable and necessary in order to provide the required intensity of service to ensure the patient's safety. The patient's presenting symptoms, physical exam findings, and initial radiographic and laboratory data in the context of their medical condition is felt to place them at decreased risk for further clinical deterioration. Furthermore, it is anticipated that the patient will be medically stable for discharge from the hospital within 2 midnights of admission.    Synetta Fail MD [facility]  How to contact the [facility] Attending or Consulting provider 7A - 7P or covering provider during after hours 7P -7A, for this patient?   Check the care team in [facility] and look for a) attending/consulting [facility] provider listed and b) the [facility] team listed Log into [email or web address] and use [facility]'s universal password to access. If you do not have the password, please contact the hospital operator. Locate the [facility] provider you are looking for under [facility] and page to a number that you can be directly reached. If you still have difficulty reaching the provider, please page the [facility] (Director on Call) for the Hospitalists listed on amion for assistance.  07/20/2022, 6:58 PM

## 2022-07-20 NOTE — ED Provider Notes (Signed)
[place] EMERGENCY DEPARTMENT AT [facility] Provider Note  Medical Decision Making   HPI: Marielis Samara is a 72 y.o. female with history perinent for anemia, anxiety, depression, HTN, HDL, GERD who presents complaining of chest pain/pressure. Patient arrived via EMS from home.  History provided by patient.  No interpreter required for this encounter.  She denies history of known CAD, prior MI, atrial fibrillation, denies use of blood thinners.  Reports that she awoke from sleep with a severe chest pressure that radiated into her neck.  Reports that she was able to tolerate p.o. today.  Pain persisted for several hours this morning.  Did not radiate to the arms or back.  Did not have any associated shortness of breath, diaphoresis, nausea, abdominal pain.  Does report that she has had dark stools for approximately 1 week which she attributes to taking ibuprofen for knee pain.  Reports that pain lasted for several hours, so she called EMS, feels that she had partial relief after receiving 324 of ASA en route, denies taking any nitroglycerin.  Reports that symptoms now 2/10.  Endorses early MI before the age of 64 in brother as well as father.  ROS: As per HPI. Please see MAR for complete past medical history, surgical history, and social history.   Physical exam is unremarkable.  The differential includes but is not limited to ACS, arrhythmia, pericardial tamponade, pericarditis, myocarditis, pneumonia, pneumothorax, esophageal, tear, perforated abdominal viscous, pulmonary embolism, aortic dissection, costochondritis, musculoskeletal chest wall pain, GERD.  Marland Kitchen  Additional history obtained from: None External records from outside source obtained and reviewed including: None  ED provider interpretation of ECG: Rate 83, sinus rhythm, no ST elevations or depressions, nonspecific T wave flattening in lead III, aVF.  ED provider interpretation of radiology/imaging: No focal  airspace opacification, cardiomediastinal silhouette derangement, pneumothorax, pleural effusion, bony displacement.  Labs ordered were interpreted by myself as well as my attending and were incorporated into the medical decision making process for this patient.  ED provider interpretation of labs: CBC with mild nonspecific leukocytosis to 10.9, no left shift.  No anemia or thrombocytopenia.  CMP without AKI or emergent electrolyte derangement.  PT/INR WNL.  Initial troponin 5, delta troponin 4.  Dimer WNL.  Interventions: None  See the EMR for full details regarding lab and imaging results.  Ms. Springsteen is awake, alert, and HDS.  Her exam does not have any focal findings, aspirin has been given PTA.  The ECG reveals no anatomical ischemia representing STEMI, New-Onset Arrhythmia, or ischemic equivalent. She has been risk stratified with a HEAR score of 5. Initial troponin is 5; delta troponin is 4.  The patient's presentation, the patient being hemodynamically stable, and the ECG are not consistent with Pericardial Tamponade. The patient's pain is not positional. This in conjunction with the lack of PR depressions and ST elevations on the ECG are reassuring against Pericarditis. The patient's non-elevated troponin and ECG are also inconsistent with Myocarditis.  The CXR is unremarkable for focal airspace disease.  The patient is afebrile and denies productive cough.  Therefore, I do not suspect Pneumonia. There is no evidence of Pneumothorax on physical exam or on the CXR. CXR shows no evidence of Esophageal Tear and there is no recent intractable emesis or esophageal instrumentation. There is no peritonitis or free air on CXR worrisome for a Perforated Abdominal Viscous.  Pulmonary Embolism is on the differential. The patient is at  risk via the Revised Geneva Criteria. Therefore, we  will further risk stratify the patient with a d-dimer.  This was negative. Therefore, a CTA not indicated.  The  patient's pain is not tearing and it does not radiate to back. Pulses are present bilaterally in both the upper and lower extremities. CXR does not show a widened mediastinum. I have a very low suspicion for Aortic Dissection.   While patient was in the emergency department she ambulated to the bathroom and had recurrent substernal chest pressure radiating to the neck.  Overall given the exertional nature, this is concerning for cardiac chest pain, despite patient's negative troponins.  Given patient has multiple risk factors, including advanced age, feel that patient is most appropriate for admission for further workup.  I consulted cardiology, and spoke with the cardiologist on-call for [facility] cardiology.  She recommended prophylactic heparin gtt. as well as admission with echo.  Hospitalist consulted for admission, accepted by Dr. Alinda Money.  No additional acute events while patient was under my care in the ED.  Consults: Cardiology, hospitalists  Disposition: ADMIT: I believe the patient requires admission for further care and management. The patient was admitted to hospitalist with cardiology consult. Please see inpatient provider note for additional treatment plan details.   The plan for this patient was discussed with Dr. Silverio Lay, who voiced agreement and who oversaw evaluation and treatment of this patient.  Clinical Impression:  1. Angina pectoris    Admit  Therapies: These medications and interventions were provided for the patient while in the ED. Medications  pantoprazole (PROTONIX) EC tablet 40 mg (has no administration in time range)  melatonin tablet 5 mg (has no administration in time range)  acetaminophen (TYLENOL) tablet 650 mg (has no administration in time range)  ondansetron (ZOFRAN) injection 4 mg (has no administration in time range)  nitroGLYCERIN (NITROSTAT) SL tablet 0.4 mg (has no administration in time range)  nitroGLYCERIN (NITROSTAT) SL tablet 0.4 mg (has no  administration in time range)  heparin bolus via infusion 4,000 Units (has no administration in time range)  heparin ADULT infusion 100 units/mL (25000 units/256mL) (has no administration in time range)  Apremilast TABS 30 mg (has no administration in time range)  atorvastatin (LIPITOR) tablet 40 mg (has no administration in time range)  buPROPion (WELLBUTRIN XL) 24 hr tablet 150 mg (has no administration in time range)  citalopram (CELEXA) tablet 20 mg (has no administration in time range)  zolpidem (AMBIEN) tablet 5 mg (has no administration in time range)  amLODipine (NORVASC) tablet 10 mg (has no administration in time range)    MDM generated using voice dictation software and may contain dictation errors.  Please contact me for any clarification or with any questions.  Clinical Complexity A medically appropriate history, review of systems, and physical exam was performed.  Collateral history obtained from: None I personally reviewed the labs, EKG, imaging as discussed above. Patient's presentation is most consistent with acute complicated illness / injury requiring diagnostic workup Considered and ruled out life and body threatening conditions  Treatment: Hospitalization Medications: Prescription Discussed patient's care with providers from the following different specialties: Cardiology, hospitalists  Physical Exam   ED Triage Vitals  Enc Vitals Group     BP 07/20/22 1445 (!) 99/57     Pulse Rate 07/20/22 1445 85     Resp 07/20/22 1448 17     Temp 07/20/22 1447 98 F (36.7 C)     Temp src --      SpO2 07/20/22 1445 98 %  Weight --      Height --      Head Circumference --      Peak Flow --      Pain Score --      Pain Loc --      Pain Edu? --      Excl. in GC? --      Physical Exam Vitals and nursing note reviewed.  Constitutional:      General: She is not in acute distress.    Appearance: She is well-developed. She is obese.  HENT:     Head: Normocephalic  and atraumatic.  Eyes:     Extraocular Movements: Extraocular movements intact.     Conjunctiva/sclera: Conjunctivae normal.  Cardiovascular:     Rate and Rhythm: Normal rate and regular rhythm.     Pulses:          Radial pulses are 2+ on the right side and 2+ on the left side.     Heart sounds: No murmur heard. Pulmonary:     Effort: Pulmonary effort is normal. No respiratory distress.     Breath sounds: Normal breath sounds.  Abdominal:     Palpations: Abdomen is soft.     Tenderness: There is no abdominal tenderness.  Musculoskeletal:        General: No swelling.     Cervical back: Neck supple.     Right lower leg: No tenderness. No edema.     Left lower leg: No tenderness. No edema.  Skin:    General: Skin is warm and dry.     Capillary Refill: Capillary refill takes less than 2 seconds.  Neurological:     General: No focal deficit present.     Mental Status: She is alert.  Psychiatric:        Mood and Affect: Mood normal.       Procedure Note  Procedures  DG Chest Portable 1 View  Final Result      Julianne Rice, MD Emergency Medicine, PGY-2   Curley Spice, MD 07/20/22 2020    Charlynne Pander, MD 07/20/22 2325

## 2022-07-20 NOTE — ED Triage Notes (Signed)
Pt BIB [facility] from home d/t CP that started 7am this morning. EKG was unremarkable with MES. 324 ASA given by EMS. VSS. A&O X4.

## 2022-07-20 NOTE — Progress Notes (Signed)
ANTICOAGULATION CONSULT NOTE - Initial Consult  Pharmacy Consult for Heparin Indication: chest pain/ACS  Allergies  Allergen Reactions   Penicillins     Patient Measurements:   Heparin Dosing Weight: 76.1 kg  Vital Signs: Temp: 98.3 F (36.8 C) (04/13 1848) Temp Source: Oral (04/13 1848) BP: 134/79 (04/13 1845) Pulse Rate: 83 (04/13 1845)  Labs: Recent Labs    07/20/22 1534 07/20/22 1621  HGB  --  12.3  HCT  --  39.9  PLT  --  346  LABPROT  --  13.4  INR  --  1.0  CREATININE  --  0.81  TROPONINIHS 5 4    CrCl cannot be calculated (Unknown ideal weight.).   Medical History: Past Medical History:  Diagnosis Date   Anemia    Anxiety    Depression    Elevated cholesterol    GERD (gastroesophageal reflux disease)    Hypertension    Psoriasis     Medications:  (Not in a hospital admission)  Scheduled:   [START ON 07/21/2022] pantoprazole  40 mg Oral Daily   Infusions:  PRN: acetaminophen, melatonin, ondansetron (ZOFRAN) IV  Assessment: 72 yof with a history of anemia, anxiety, depression, GERD, HTN, HLD, psoriasis. Patient is presenting with chest pain. Heparin per pharmacy consult placed for chest pain/ACS.  Patient is not on anticoagulation prior to arrival.  Hgb 12.3; plt 346 PT/INR 13.4/1  Goal of Therapy:  Heparin level 0.3-0.7 units/ml Monitor platelets by anticoagulation protocol: Yes   Plan:  Give IV heparin 4000 units bolus x 1 Start heparin infusion at 900 units/hr Check anti-Xa level in 8 hours and daily while on heparin Continue to monitor H&H and platelets  Delmar Landau, PharmD, BCPS 07/20/2022 7:05 PM ED Clinical Pharmacist -  [phone]

## 2022-07-20 NOTE — ED Notes (Signed)
Report given to Harrington Challenger, RN of [phone]

## 2022-07-21 ENCOUNTER — Observation Stay (HOSPITAL_COMMUNITY): Payer: Medicare Other

## 2022-07-21 DIAGNOSIS — I209 Angina pectoris, unspecified: Secondary | ICD-10-CM | POA: Diagnosis not present

## 2022-07-21 DIAGNOSIS — F32A Depression, unspecified: Secondary | ICD-10-CM | POA: Diagnosis not present

## 2022-07-21 DIAGNOSIS — R079 Chest pain, unspecified: Secondary | ICD-10-CM | POA: Diagnosis not present

## 2022-07-21 DIAGNOSIS — F419 Anxiety disorder, unspecified: Secondary | ICD-10-CM | POA: Diagnosis not present

## 2022-07-21 LAB — ECHOCARDIOGRAM COMPLETE
AR max vel: 1.19 cm2
AV Area VTI: 1.34 cm2
AV Area mean vel: 1.21 cm2
AV Mean grad: 11 mmHg
AV Peak grad: 21.5 mmHg
Ao pk vel: 2.32 m/s
Area-P 1/2: 3.27 cm2
Height: 65 in
P 1/2 time: 507 msec
S' Lateral: 3.2 cm
Weight: 3185.6 oz

## 2022-07-21 LAB — COMPREHENSIVE METABOLIC PANEL
ALT: 19 U/L (ref 0–44)
AST: 23 U/L (ref 15–41)
Albumin: 3.5 g/dL (ref 3.5–5.0)
Alkaline Phosphatase: 112 U/L (ref 38–126)
Anion gap: 12 (ref 5–15)
BUN: 11 mg/dL (ref 8–23)
CO2: 23 mmol/L (ref 22–32)
Calcium: 9.3 mg/dL (ref 8.9–10.3)
Chloride: 103 mmol/L (ref 98–111)
Creatinine, Ser: 0.9 mg/dL (ref 0.44–1.00)
GFR, Estimated: >60 mL/min (ref >60)
Glucose, Bld: 128 mg/dL — ABNORMAL HIGH (ref 70–99)
Potassium: 3.9 mmol/L (ref 3.5–5.1)
Sodium: 138 mmol/L (ref 135–145)
Total Bilirubin: 1.2 mg/dL (ref 0.3–1.2)
Total Protein: 7.2 g/dL (ref 6.5–8.1)

## 2022-07-21 LAB — CBC WITH DIFFERENTIAL/PLATELET
Abs Immature Granulocytes: 0.02 10*3/uL (ref 0.00–0.07)
Basophils Absolute: 0.1 10*3/uL (ref 0.0–0.1)
Basophils Relative: 1 %
Eosinophils Absolute: 0.2 10*3/uL (ref 0.0–0.5)
Eosinophils Relative: 2 %
HCT: 38.7 % (ref 36.0–46.0)
Hemoglobin: 12.4 g/dL (ref 12.0–15.0)
Immature Granulocytes: 0 %
Lymphocytes Relative: 26 %
Lymphs Abs: 2 10*3/uL (ref 0.7–4.0)
MCH: 27.6 pg (ref 26.0–34.0)
MCHC: 32 g/dL (ref 30.0–36.0)
MCV: 86.2 fL (ref 80.0–100.0)
Monocytes Absolute: 0.5 10*3/uL (ref 0.1–1.0)
Monocytes Relative: 7 %
Neutro Abs: 4.8 10*3/uL (ref 1.7–7.7)
Neutrophils Relative %: 64 %
Platelets: 311 10*3/uL (ref 150–400)
RBC: 4.49 MIL/uL (ref 3.87–5.11)
RDW: 13.7 % (ref 11.5–15.5)
WBC: 7.6 10*3/uL (ref 4.0–10.5)
nRBC: 0 % (ref 0.0–0.2)

## 2022-07-21 LAB — HEPARIN LEVEL (UNFRACTIONATED)
Heparin Unfractionated: 0.38 IU/mL (ref 0.30–0.70)
Heparin Unfractionated: 0.27 IU/mL — ABNORMAL LOW (ref 0.30–0.70)

## 2022-07-21 LAB — BRAIN NATRIURETIC PEPTIDE: B Natriuretic Peptide: 66.8 pg/mL (ref 0.0–100.0)

## 2022-07-21 MED ORDER — METOPROLOL SUCCINATE ER 25 MG PO TB24: 25.0000 mg | ORAL_TABLET | Freq: Every day | ORAL | 0 refills | Status: DC

## 2022-07-21 MED ORDER — NITROGLYCERIN 0.4 MG SL SUBL: 0.4000 mg | SUBLINGUAL_TABLET | SUBLINGUAL | 0 refills | Status: DC | PRN

## 2022-07-21 MED ORDER — BUPROPION HCL ER (XL) 150 MG PO TB24: 150.0000 mg | ORAL_TABLET | Freq: Every day | ORAL | 0 refills | Status: AC

## 2022-07-21 MED ORDER — METOPROLOL SUCCINATE ER 25 MG PO TB24: 25.0000 mg | ORAL_TABLET | Freq: Every day | ORAL | Status: DC

## 2022-07-21 MED ORDER — HEPARIN BOLUS VIA INFUSION
1000.0000 [IU] | Freq: Once | INTRAVENOUS | Status: AC
  Administered 2022-07-21: 1000 [IU] via INTRAVENOUS
  Filled 2022-07-21: qty 1000

## 2022-07-21 NOTE — Discharge Summary (Signed)
Physician Discharge Summary  Nicole Richmond ZOX:[id number] DOB: [birth year] DOA: 07/20/2022  PCP: Merri Brunette, MD  Admit date: 07/20/2022 Discharge date: 07/22/2022  Time spent: 30 minutes  Recommendations for Outpatient Follow-up:   Chest pain, rule out ACS > and radiating to her neck. Patient presenting with chest pain that awoke her from sleep > Somewhat improved with aspirin given by EMS.  No nitroglycerin thus far. > No diaphoresis, nausea, shortness of breath. > Does have personal history of hypertension and hyperlipidemia with family history of MI at less than 44 years of age for her brother and father. > Cardiology consulted in the ED recommending observation, heparin, echo. > Had dark stool recently, hemoglobin stable, but FOBT ordered given patient started on heparin drip. - Echocardiogram - EKG as needed - Heparin drip  - Increasing chest pain when seen: nitro, repeat EKG, repeat troponin -ACS ruled out -Follow-up with cardiology for outpatient stress test.  Will be set up by cardiology.   Hypertension -Counseled on low-salt diet. -Amlodipine 10 mg daily - Toprol 25 mg daily   Hyperlipidemia -Lipitor 40 mg daily   Anxiety/Depression - Continue home Celexa and bupropion - Continue home zaleplon   GERD - Continue PPI   Psoriasis - Continue home apremilast   Obesity (BMI 33.13 kg/m) -Address with PCP   Discharge Diagnoses:  Principal Problem:   Chest pain, rule out acute myocardial infarction Active Problems:   HTN (hypertension)   HLD (hyperlipidemia)   GERD (gastroesophageal reflux disease)   Anxiety   Depression   Psoriasis   Discharge Condition: Stable  Diet recommendation: Heart healthy  Filed Weights   07/20/22 1908 07/20/22 2039  Weight: 90.9 kg 90.3 kg    History of present illness:  72 y.o. WF PMHx Anxiety, Depression, anemia, , GERD, essential HTN, HLD, psoriasis    Presenting with chest pain.Awoke from sleep with severe  chest pain/pressure radiation to her neck.  Reports pain for several hours.  Denies any associated nausea, diaphoresis, shortness of breath.  Does report a dark stool week ago which she attributes to taking ibuprofen for pain.  Called EMS and pain improved somewhat with aspirin and route.  2 out of 10 in the ED. Now increased to 8-9/10 on my exam   Denies fevers, chills, abdominal pain, constipation, diarrhea, nausea, vomiting.   Reports family history significant for both her brother and father having heart attacks at less than 72 years of age.   ED Course: Vital signs in the ED notable for blood pressure in the 90s to 120s systolic.  Lab workup included CMP within normal limits.  CBC with mild leukocytosis to 10.9.  PT, PTT, INR within normal limits.  Troponin negative x 2.  D-dimer normal.  FOBT pending.  Chest x-ray showed no acute normality.  Cardiology consulted and recommended prophylactic heparin drip and echocardiogram with monitoring overnight, will see the patient.  Hospital Course:  See above  Procedures: 4/14 echocardiogram Left Ventricle: LVEF= 60 to 65%.  -Grade I diastolic dysfunction (impaired relaxation).      Consultations: Cardiology Dr. Laurance Flatten    Discharge Exam: Vitals:   07/21/22 0414 07/21/22 0829 07/21/22 0940 07/21/22 1706  BP: (!) 141/74 125/79 128/82 (!) 125/94  Pulse: 83 86 81   Resp: 20 18 18    Temp: 98.3 F (36.8 C) 98.9 F (37.2 C) 98.1 F (36.7 C) 98.3 F (36.8 C)  TempSrc: Oral Oral Oral Oral  SpO2:  95% 98% 95%  Weight:  Height:        General: A/O x 4, no acute respiratory distress Eyes: negative scleral hemorrhage, negative anisocoria, negative icterus ENT: Negative Runny nose, negative gingival bleeding, Neck:  Negative scars, masses, torticollis, lymphadenopathy, JVD Lungs: Clear to auscultation bilaterally without wheezes or crackles Cardiovascular: Regular rate and rhythm without murmur gallop or rub normal S1 and  S2   Discharge Instructions   Allergies as of 07/21/2022       Reactions   Bee Venom Anaphylaxis   Penicillins Swelling, Rash        Medication List     STOP taking these medications    BUPROPION HCL PO Replaced by: buPROPion 150 MG 24 hr tablet       TAKE these medications    acetaminophen 500 MG tablet Commonly known as: TYLENOL Take 1,000 mg by mouth as needed for moderate pain.   amLODipine 10 MG tablet Commonly known as: NORVASC Take 10 mg by mouth daily.   Apremilast 30 MG Tabs Take 30 mg by mouth 2 (two) times daily.   atorvastatin 40 MG tablet Commonly known as: LIPITOR Take 40 mg by mouth daily.   augmented betamethasone dipropionate 0.05 % cream Commonly known as: DIPROLENE-AF Apply 1 Application topically 2 (two) times daily as needed (psoriasis).   buPROPion 150 MG 24 hr tablet Commonly known as: WELLBUTRIN XL Take 1 tablet (150 mg total) by mouth daily. Replaces: BUPROPION HCL PO   CALCIUM + D3 PO Take 1 tablet by mouth daily.   citalopram 20 MG tablet Commonly known as: CELEXA Take 20 mg by mouth daily.   EpiPen 2-Pak 0.3 mg/0.3 mL Soaj injection Generic drug: EPINEPHrine Inject 0.3 mg into the muscle as needed for anaphylaxis.   ibuprofen 200 MG tablet Commonly known as: ADVIL Take 400-600 mg by mouth as needed for moderate pain.   LORATADINE ALLERGY RELIEF PO Take by mouth.   melatonin 5 MG Tabs Take 5 mg by mouth at bedtime.   metoprolol succinate 25 MG 24 hr tablet Commonly known as: TOPROL-XL Take 1 tablet (25 mg total) by mouth daily.   nitroGLYCERIN 0.4 MG SL tablet Commonly known as: NITROSTAT Place 1 tablet (0.4 mg total) under the tongue every 5 (five) minutes as needed for chest pain.   ONE DAILY WOMENS 50+ PO Take 1 tablet by mouth daily.   pantoprazole 40 MG tablet Commonly known as: PROTONIX Take 40 mg by mouth daily.   PROBIOTIC PO Take 1 capsule by mouth at bedtime.   triamcinolone cream 0.1  % Commonly known as: KENALOG Apply 1 Application topically as needed (psoriasis).   VITAMIN C PO Take 1 tablet by mouth daily.   zaleplon 10 MG capsule Commonly known as: SONATA Take 10 mg by mouth at bedtime as needed for sleep.   ZINC PO Take 1 tablet by mouth daily.       Allergies  Allergen Reactions   Bee Venom Anaphylaxis   Penicillins Swelling and Rash      The results of significant diagnostics from this hospitalization (including imaging, microbiology, ancillary and laboratory) are listed below for reference.    Significant Diagnostic Studies: ECHOCARDIOGRAM COMPLETE  Result Date: 07/21/2022    ECHOCARDIOGRAM REPORT   Patient Name:   Little Ishikawa Date of Exam: 07/21/2022 Medical Rec #:  [id number]            Height:       65.0 in Accession #:    [id number]  Weight:       199.1 lb Date of Birth:  08-Jul-1950             BSA:          1.974 m Patient Age:    72 years             BP:           125/79 mmHg Patient Gender: F                    HR:           86 bpm. Exam Location:  Inpatient Procedure: 2D Echo, Cardiac Doppler and Color Doppler Indications:    Chest Pain R07.9  History:        Patient has prior history of Echocardiogram examinations, most                 recent 03/29/2014. Risk Factors:Hypertension and Dyslipidemia.                 GERD (gastroesophageal reflux disease).  Sonographer:    Celesta Gentile RCS Referring Phys: [id number] Tanay Massiah J Maie Kesinger IMPRESSIONS  1. Left ventricular ejection fraction, by estimation, is 60 to 65%. Left ventricular ejection fraction by PLAX is 64 %. The left ventricle has normal function. The left ventricle has no regional wall motion abnormalities. Left ventricular diastolic parameters are consistent with Grade I diastolic dysfunction (impaired relaxation).  2. Right ventricular systolic function is normal. The right ventricular size is normal.  3. The mitral valve is normal in structure. Trivial mitral valve regurgitation.  4.  The aortic valve is normal in structure. Aortic valve regurgitation is mild. Aortic valve sclerosis is present, with no evidence of aortic valve stenosis. FINDINGS  Left Ventricle: Left ventricular ejection fraction, by estimation, is 60 to 65%. Left ventricular ejection fraction by PLAX is 64 %. The left ventricle has normal function. The left ventricle has no regional wall motion abnormalities. The left ventricular internal cavity size was normal in size. There is no left ventricular hypertrophy. Left ventricular diastolic parameters are consistent with Grade I diastolic dysfunction (impaired relaxation). Right Ventricle: The right ventricular size is normal. No increase in right ventricular wall thickness. Right ventricular systolic function is normal. Left Atrium: Left atrial size was normal in size. Right Atrium: Right atrial size was normal in size. Pericardium: There is no evidence of pericardial effusion. Mitral Valve: The mitral valve is normal in structure. Trivial mitral valve regurgitation. Tricuspid Valve: The tricuspid valve is normal in structure. Tricuspid valve regurgitation is trivial. Aortic Valve: The aortic valve is normal in structure. Aortic valve regurgitation is mild. Aortic regurgitation PHT measures 507 msec. Aortic valve sclerosis is present, with no evidence of aortic valve stenosis. Aortic valve mean gradient measures 11.0 mmHg. Aortic valve peak gradient measures 21.5 mmHg. Aortic valve area, by VTI measures 1.34 cm. Pulmonic Valve: The pulmonic valve was normal in structure. Pulmonic valve regurgitation is not visualized. Aorta: The aortic root is normal in size and structure. IAS/Shunts: No atrial level shunt detected by color flow Doppler.  LEFT VENTRICLE PLAX 2D LV EF:         Left            Diastology                ventricular     LV e' medial:    5.91 cm/s  ejection        LV E/e' medial:  11.2                fraction by     LV e' lateral:   9.64 cm/s                 PLAX is 64      LV E/e' lateral: 6.9                %. LVIDd:         4.90 cm LVIDs:         3.20 cm LV PW:         1.20 cm LV IVS:        1.10 cm LVOT diam:     1.70 cm LV SV:         62 LV SV Index:   31 LVOT Area:     2.27 cm  RIGHT VENTRICLE RV S prime:     14.40 cm/s TAPSE (M-mode): 2.3 cm LEFT ATRIUM             Index        RIGHT ATRIUM           Index LA diam:        3.70 cm 1.87 cm/m   RA Area:     20.30 cm LA Vol (A2C):   82.7 ml 41.89 ml/m  RA Volume:   68.70 ml  34.80 ml/m LA Vol (A4C):   77.7 ml 39.35 ml/m LA Biplane Vol: 86.0 ml 43.56 ml/m  AORTIC VALVE AV Area (Vmax):    1.19 cm AV Area (Vmean):   1.21 cm AV Area (VTI):     1.34 cm AV Vmax:           232.00 cm/s AV Vmean:          152.000 cm/s AV VTI:            0.463 m AV Peak Grad:      21.5 mmHg AV Mean Grad:      11.0 mmHg LVOT Vmax:         121.50 cm/s LVOT Vmean:        81.250 cm/s LVOT VTI:          0.272 m LVOT/AV VTI ratio: 0.59 AI PHT:            507 msec  AORTA Ao Root diam: 3.30 cm MITRAL VALVE MV Area (PHT): 3.27 cm    SHUNTS MV Decel Time: 232 msec    Systemic VTI:  0.27 m MV E velocity: 66.20 cm/s  Systemic Diam: 1.70 cm MV A velocity: 91.20 cm/s MV E/A ratio:  0.73 Sabina Custovic Electronically signed by Clotilde Dieter Signature Date/Time: 07/21/2022/2:53:34 PM    Final    DG Chest Portable 1 View  Result Date: 07/20/2022 CLINICAL DATA:  Chest pain and pressure. EXAM: PORTABLE CHEST 1 VIEW COMPARISON:  01/16/2006 FINDINGS: The heart size and mediastinal contours are within normal limits. Both lungs are clear. The visualized skeletal structures are unremarkable. IMPRESSION: No active disease. Electronically Signed   By: Danae Orleans M.D.   On: 07/20/2022 16:48    Microbiology: No results found for this or any previous visit (from the past 240 hour(s)).   Labs: Basic Metabolic Panel: Recent Labs  Lab 07/20/22 1621 07/21/22 0907  NA 137 138  K 4.0 3.9  CL 102 103  CO2 25 23  GLUCOSE 97 128*  BUN 11 11   CREATININE 0.81 0.90  CALCIUM 9.4 9.3   Liver Function Tests: Recent Labs  Lab 07/20/22 1621 07/21/22 0907  AST 22 23  ALT 22 19  ALKPHOS 117 112  BILITOT 1.0 1.2  PROT 7.5 7.2  ALBUMIN 3.8 3.5   No results for input(s): "LIPASE", "AMYLASE" in the last 168 hours. No results for input(s): "AMMONIA" in the last 168 hours. CBC: Recent Labs  Lab 07/20/22 1621 07/21/22 0907  WBC 10.9* 7.6  NEUTROABS 7.3 4.8  HGB 12.3 12.4  HCT 39.9 38.7  MCV 86.9 86.2  PLT 346 311   Cardiac Enzymes: No results for input(s): "CKTOTAL", "CKMB", "CKMBINDEX", "TROPONINI" in the last 168 hours. BNP: BNP (last 3 results) Recent Labs    07/21/22 0907  BNP 66.8    ProBNP (last 3 results) No results for input(s): "PROBNP" in the last 8760 hours.  CBG: No results for input(s): "GLUCAP" in the last 168 hours.     Signed:  Carolyne Littles, MD [facility]

## 2022-07-21 NOTE — Progress Notes (Signed)
ANTICOAGULATION CONSULT NOTE  Pharmacy Consult for Heparin Indication: chest pain/ACS  Allergies  Allergen Reactions   Penicillins     Patient Measurements: Height: 5\' 5"  (165.1 cm) Weight: 90.3 kg (199 lb 1.6 oz) IBW/kg (Calculated) : 57 Heparin Dosing Weight: 76.1 kg  Vital Signs: Temp: 98.9 F (37.2 C) (04/14 0829) Temp Source: Oral (04/14 0829) BP: 125/79 (04/14 0829) Pulse Rate: 86 (04/14 0829)  Labs: Recent Labs    07/20/22 1534 07/20/22 1621 07/20/22 2042 07/21/22 0241 07/21/22 0807  HGB  --  12.3  --   --   --   HCT  --  39.9  --   --   --   PLT  --  346  --   --   --   LABPROT  --  13.4  --   --   --   INR  --  1.0  --   --   --   HEPARINUNFRC  --   --   --  0.38 0.27*  CREATININE  --  0.81  --   --   --   TROPONINIHS 5 4 4   --   --      Estimated Creatinine Clearance: 69.7 mL/min (by C-G formula based on SCr of 0.81 mg/dL).   Medical History: Past Medical History:  Diagnosis Date   Anemia    Anxiety    Depression    Elevated cholesterol    GERD (gastroesophageal reflux disease)    Hypertension    Psoriasis     Medications:  Medications Prior to Admission  Medication Sig Dispense Refill Last Dose   Apremilast 30 MG TABS Take 30 mg by mouth 2 (two) times daily.      atorvastatin (LIPITOR) 40 MG tablet Take 40 mg by mouth daily.      BUPROPION HCL PO Take 150 mg by mouth daily.      Calcium Carb-Cholecalciferol (CALCIUM + D3 PO) Take by mouth.      citalopram (CELEXA) 20 MG tablet Take 20 mg by mouth daily.      Cyanocobalamin (B-12 PO) Take 1,000 mcg by mouth daily.      EPIPEN 2-PAK 0.3 MG/0.3ML SOAJ injection       LORATADINE ALLERGY RELIEF PO Take by mouth.      Melatonin 5 MG TABS Take 5 mg by mouth at bedtime as needed.      Multiple Vitamins-Minerals (ONE DAILY WOMENS 50+ PO) Take by mouth daily.      pantoprazole (PROTONIX) 40 MG tablet Take 40 mg by mouth daily.      Probiotic Product (PROBIOTIC PO) Take by mouth.      zaleplon  (SONATA) 10 MG capsule Take 10 mg by mouth at bedtime as needed for sleep.       Scheduled:   amLODipine  10 mg Oral Daily   Apremilast  30 mg Oral BID   atorvastatin  40 mg Oral Daily   buPROPion  150 mg Oral Daily   citalopram  20 mg Oral Daily   nitroGLYCERIN  0.4 mg Sublingual Once   pantoprazole  40 mg Oral Daily   Infusions:   heparin 900 Units/hr (07/20/22 2221)   PRN: acetaminophen, melatonin, nitroGLYCERIN, ondansetron (ZOFRAN) IV, zolpidem  Assessment: Nicole Richmond with a history of anemia, anxiety, depression, GERD, HTN, HLD, psoriasis. Patient is presenting with chest pain. Heparin per pharmacy consult placed for chest pain/ACS.  Patient is not on anticoagulation prior to arrival. Hgb 12.4; plt 311. Heparin  level is subtherapeutic at 0.27. No issues with infusion or bleeding noted.   Goal of Therapy:  Heparin level 0.3-0.7 units/ml Monitor platelets by anticoagulation protocol: Yes   Plan:  Re-bolus IV heparin 1000 units x 1 Increase heparin infusion to 1050 units/hr Check anti-Xa level in 8 hours and daily while on heparin Continue to monitor H&H and platelets  Andreas Ohm, PharmD Pharmacy Resident  07/21/2022 8:47 AM

## 2022-07-21 NOTE — Progress Notes (Signed)
ANTICOAGULATION CONSULT NOTE - Follow Up Consult  Pharmacy Consult for heparin Indication: chest pain/ACS  Labs: Recent Labs    07/20/22 1534 07/20/22 1621 07/20/22 2042 07/21/22 0241  HGB  --  12.3  --   --   HCT  --  39.9  --   --   PLT  --  346  --   --   LABPROT  --  13.4  --   --   INR  --  1.0  --   --   HEPARINUNFRC  --   --   --  0.38  CREATININE  --  0.81  --   --   TROPONINIHS 5 4 4   --     Assessment/Plan:  71yo female therapeutic on heparin with initial dosing for CP. Will continue infusion at current rate of 900 units/hr and confirm stable with additional level.     Vernard Gambles, PharmD, BCPS  07/21/2022,4:11 AM

## 2022-07-21 NOTE — Consult Note (Signed)
CARDIOLOGY CONSULT NOTE  [name] MRN: [id number] DOB/AGE: 1951-03-23 72 y.o.  Admit date: 07/20/2022 Referring Physician  Dr Lucretia Roers Primary Physician:  Nicole Brunette, MD Reason for Consultation  chest pressure  [name], female    DOB: 23-Feb-1951, 72 y.o.   MRN: [id number]  Chief Complaint  Patient presents with   Chest Pain   HPI:    Nicole Richmond  is a 72 y.o. female with past medical history significant for hypertension, hyperlipidemia, and depression who presented to the ED with chest pressure.  She states she woke up yesterday morning with pressure on her upper sternum radiating to her neck.  She has never had anything like this in the past.  Patient states that she had 2 cups of coffee yesterday morning when she woke up and then she ate some cheese-itz.  Then for lunch she began eating a can of peas which caused her chest pressure to get much worse.  She began to get really worried and she called EMS, patient recalls her blood pressure being 175/100 on the way to the ED but she was normotensive on arrival.  She has not had any chest pressure or pain since being in the hospital with normal blood pressure.  We did discuss at length the importance of a low-sodium diet.  Patient really wants to go home today and as long as her echocardiogram is normal I am comfortable sending her home.  I will obtain nuclear stress test in the office.  She should go get her coronary artery calcium score which was ordered in January of this year.  At this time she denies chest pain, shortness of breath, palpitations, diaphoresis, syncope, edema, orthopnea, PND.  Past Medical History:  Diagnosis Date   Anemia    Anxiety    Depression    Elevated cholesterol    GERD (gastroesophageal reflux disease)    Hypertension    Psoriasis    Past Surgical History:  Procedure Laterality Date   finger injury Left 05/2012   fore finger cut opened(5 stitches)   TOTAL  ABDOMINAL HYSTERECTOMY  1991   DUB-OV retained   TUBAL LIGATION  1978   Social History   Tobacco Use   Smoking status: Former    Types: Cigarettes    Quit date: 12/02/1998    Years since quitting: 23.6   Smokeless tobacco: Never  Substance Use Topics   Alcohol use: Yes    Alcohol/week: 1.0 standard drink of alcohol    Types: 1 drink(s) per week    Comment: occ    Family History  Problem Relation Age of Onset   Thyroid disease Mother    Atrial fibrillation Mother    Osteoarthritis Mother    Heart disease Father    Heart disease Brother    Breast cancer Paternal Grandmother        unsure if it was cancer    Marital Status: Single  ROS  Review of Systems  Cardiovascular:  Positive for chest pain.   Objective      07/21/2022    9:40 AM 07/21/2022    8:29 AM 07/21/2022    4:14 AM  Vitals with BMI  Systolic 128 125 782  Diastolic 82 79 74  Pulse 81 86 83    Blood pressure 128/82, pulse 81, temperature 98.1 F (36.7 C), temperature source Oral, resp. rate 18, height  (1.651 m), weight 90.3 kg, last menstrual period 04/08/1989, SpO2 98 %.  Physical Exam Vitals reviewed.  HENT:     Head: Normocephalic and atraumatic.  Cardiovascular:     Rate and Rhythm: Normal rate and regular rhythm.     Pulses: Normal pulses.     Heart sounds: Normal heart sounds. No murmur heard. Pulmonary:     Effort: Pulmonary effort is normal.     Breath sounds: Normal breath sounds.  Abdominal:     General: Bowel sounds are normal.  Musculoskeletal:     Right lower leg: No edema.     Left lower leg: No edema.  Skin:    General: Skin is warm and dry.  Neurological:     Mental Status: She is alert.    Laboratory examination:   Recent Labs    07/20/22 1621  NA 137  K 4.0  CL 102  CO2 25  GLUCOSE 97  BUN 11  CREATININE 0.81  CALCIUM 9.4  GFRNONAA >60   estimated creatinine clearance is 69.7 mL/min (by C-G formula based on SCr of 0.81 mg/dL).     Latest Ref Rng &  Units 07/20/2022    4:21 PM  CMP  Glucose 70 - 99 mg/dL 97   BUN 8 - 23 mg/dL 11   Creatinine 4.09 - 1.00 mg/dL 8.11   Sodium 914 - 782 mmol/L 137   Potassium 3.5 - 5.1 mmol/L 4.0   Chloride 98 - 111 mmol/L 102   CO2 22 - 32 mmol/L 25   Calcium 8.9 - 10.3 mg/dL 9.4   Total Protein 6.5 - 8.1 g/dL 7.5   Total Bilirubin 0.3 - 1.2 mg/dL 1.0   Alkaline Phos 38 - 126 U/L 117   AST 15 - 41 U/L 22   ALT 0 - 44 U/L 22       Latest Ref Rng & Units 07/21/2022    9:07 AM 07/20/2022    4:21 PM  CBC  WBC 4.0 - 10.5 K/uL 7.6  10.9   Hemoglobin 12.0 - 15.0 g/dL 95.6  21.3   Hematocrit 36.0 - 46.0 % 38.7  39.9   Platelets 150 - 400 K/uL 311  346    Lipid Panel No results for input(s): "CHOL", "TRIG", "LDLCALC", "VLDL", "HDL", "CHOLHDL", "LDLDIRECT" in the last 8760 hours.  HEMOGLOBIN A1C No results found for: "HGBA1C", "MPG" TSH No results for input(s): "TSH" in the last 8760 hours. BNP (last 3 results) No results for input(s): "BNP" in the last 8760 hours. Cardiac Panel (last 3 results) Recent Labs    07/20/22 1534 07/20/22 1621 07/20/22 2042  TROPONINIHS Medications and allergies   Allergies  Allergen Reactions   Penicillins      No outpatient medications have been marked as taking for the 07/20/22 encounter [facility] Encounter).    Scheduled Meds:  amLODipine  10 mg Oral Daily   Apremilast  30 mg Oral BID   atorvastatin  40 mg Oral Daily   buPROPion  150 mg Oral Daily   citalopram  20 mg Oral Daily   heparin  1,000 Units Intravenous Once   nitroGLYCERIN  0.4 mg Sublingual Once   pantoprazole  40 mg Oral Daily   Continuous Infusions:  heparin 900 Units/hr (07/20/22 2221)   PRN Meds:.acetaminophen, melatonin, nitroGLYCERIN, ondansetron (ZOFRAN) IV, zolpidem   I/O last 3 completed shifts: In: 460.6 [P.O.:360; I.V.:100.6] Out: -  Total I/O In: 240 [P.O.:240] Out: -   Net IO Since Admission: 700.63 mL [07/21/22 0959]   Radiology:  Imaging results  have been reviewed and DG Chest Portable 1 View  Result Date: 07/20/2022 CLINICAL DATA:  Chest pain and pressure. EXAM: PORTABLE CHEST 1 VIEW COMPARISON:  01/16/2006 FINDINGS: The heart size and mediastinal contours are within normal limits. Both lungs are clear. The visualized skeletal structures are unremarkable. IMPRESSION: No active disease. Electronically Signed   By: Danae Orleans M.D.   On: 07/20/2022 16:48    Cardiac Studies:   Echo ordered, pending d/c  CACS ordered 04/11/2022  LVEF normal in 2015.  EKG:  07/20/2022: normal sinus rhythm, low voltage in precordial leads. Normal R wave progression. No ischemia. Unchanged compared to priors.  Assessment & Recommendations:   Hypertension with hypertensive urgency With EMS her BP was >175/100 suspect this caused her chest pressure as it has been resolved since she's been here. Chest pain resolved once BP normalized in ED Will add Toprol XL 25 mg qDay Continue current cardiac medications. Encourage low-sodium diet, less than 2000 mg daily.   Chest pressure likely 2/2 above  Ok to to d/c heparin gtt Echo ordered, if normal can d/c home Will follow-up with [place] in office I will obtain stress test in outpatient setting She is chest pain free and EKG without ischemia Patient should get CACS that was ordered in January Troponins have been normal    Clotilde Dieter, DO 07/21/2022, 9:59 AM Office: [phone]

## 2022-07-21 NOTE — Progress Notes (Signed)
*  PRELIMINARY RESULTS* Echocardiogram 2D Echocardiogram has been performed.  Stacey Drain 07/21/2022, 1:43 PM

## 2022-07-21 NOTE — Plan of Care (Signed)

## 2022-07-23 ENCOUNTER — Telehealth: Payer: Self-pay

## 2022-07-23 NOTE — Telephone Encounter (Signed)
Location of hospitalization: [place] Reason for hospitalization: high blood pressure  Date of discharge: 07/21/2022 Date of first communication with patient: today Person contacting patient: Me Current symptoms: None Do you understand why you were in the Hospital: Yes Questions regarding discharge instructions: None Where were you discharged to: Home Medications reviewed: Yes Allergies reviewed: Yes Dietary changes reviewed: Yes. Discussed low fat and low salt diet.  Referals reviewed: NA Activities of Daily Living: Able to with mild limitations Any transportation issues/concerns: None Any patient concerns: None Confirmed importance & date/time of Follow up appt: Yes Confirmed with patient if condition begins to worsen call. Pt was given the office number and encouraged to call back with questions or concerns: Yes

## 2022-07-31 ENCOUNTER — Ambulatory Visit: Payer: Medicare Other | Admitting: Internal Medicine

## 2022-08-13 ENCOUNTER — Ambulatory Visit (HOSPITAL_COMMUNITY): Payer: Medicare Other

## 2022-08-13 ENCOUNTER — Encounter (HOSPITAL_COMMUNITY): Payer: Self-pay

## 2022-12-20 ENCOUNTER — Encounter: Payer: Self-pay | Admitting: Internal Medicine

## 2022-12-20 ENCOUNTER — Ambulatory Visit: Payer: Medicare Other | Attending: Internal Medicine | Admitting: Internal Medicine

## 2022-12-20 VITALS — BP 118/78 | HR 69 | Ht 65.0 in | Wt 211.2 lb

## 2022-12-20 DIAGNOSIS — I10 Essential (primary) hypertension: Secondary | ICD-10-CM

## 2022-12-20 NOTE — Progress Notes (Signed)
Cardiology Office Note:  .   Date:  12/20/2022  ID:  Little Ishikawa, DOB 18-Apr-1950, MRN [id number] PCP: Merri Brunette, MD  [facility] Providers Cardiologist:  None    History of Present Illness: .   Nicole Richmond is a 72 y.o. female hx of HTN, HLD   History of Present Illness   She  presents for a cardiology consultation following a hospital admission in April for chest pressure. She describes the pressure as non-painful and heavy, which progressively worsened throughout the day of the event. She denies any associated symptoms such as arm pain or shortness of breath. The patient's blood pressure was elevated at the time of the event, which she attributes to ongoing family stress. She has not experienced any similar episodes since April.  The patient also reports intermittent lower extremity edema, particularly after periods of prolonged standing or walking, and following consumption of salty foods. The edema resolves overnight. She denies any associated shortness of breath or need for multiple pillows while sleeping.  The patient has a family history of heart disease, with her father having multiple heart attacks and her mother diagnosed with aortic valve stenosis.      ROS:  per HPI otherwise negative   Studies Reviewed: Marland Kitchen        EKG Interpretation Date/Time:  Friday December 20 2022 14:41:20 EDT Ventricular Rate:  69 PR Interval:  176 QRS Duration:  86 QT Interval:  402 QTC Calculation: 430 R Axis:   -8  Text Interpretation: Normal sinus rhythm Inferior infarct , age undetermined Anterior infarct , age undetermined When compared with ECG of 20-Jul-2022 19:12, PREVIOUS ECG IS PRESENT Confirmed by Carolan Clines [phone]) on 12/20/2022 2:55:27 PM  TTE 07/21/2022 Normal LVEF/RV Mild AS No Pulmonary HTN  Risk Assessment/Calculations:        Physical Exam:   VS:   Vitals:   12/20/22 1438  BP: 118/78  Pulse: 69  SpO2: 97%     LMP 04/08/1989    Wt  Readings from Last 3 Encounters:  07/20/22 199 lb 1.6 oz (90.3 kg)  12/04/20 219 lb 3.2 oz (99.4 kg)  11/03/17 222 lb (100.7 kg)    GEN: Well nourished, well developed in no acute distress NECK: No JVD; No carotid bruits CARDIAC: RRR, SEM RUSB radiates to the apex, rubs, gallops RESPIRATORY:  Clear to auscultation without rales, wheezing or rhonchi  ABDOMEN: Soft, non-tender, non-distended EXTREMITIES:  No edema; No deformity   ASSESSMENT AND PLAN: .   Assessment and Plan    Chest Pressure Single episode in April 2024, no recurrence. No associated symptoms. EKG suggested possible past myocardial infarction, but echocardiogram showed normal heart function. -we discussed taking a daily baby aspirin. -If symptoms recur, will plan for stress testing.  Lower Extremity Edema Noted swelling in feet and ankles, particularly with prolonged standing or walking, and consumption of salty foods. No associated shortness of breath or need for multiple pillows while sleeping. -Consider use of compression stockings.  Aortic Stenosis Auscultated aortic stenosis, not previously identified on echocardiogram in April 2024. No associated symptoms such as fainting or daily lightheadedness. -Monitor for symptoms and consider follow-up in one year.     - the valve has mild calcification; opens well. Mean gradient was 11 mmHg it's mild AS    Dispo: Follow up 1 year  Signed, Dezyre Hoefer, Alben Spittle, MD

## 2022-12-20 NOTE — Patient Instructions (Signed)
Medication Instructions:  Your physician recommends that you continue on your current medications as directed. Please refer to the Current Medication list given to you today.  *If you need a refill on your cardiac medications before your next appointment, please call your pharmacy*   Lab Work: NONE If you have labs (blood work) drawn today and your tests are completely normal, you will receive your results only by: MyChart Message (if you have MyChart) OR A paper copy in the mail If you have any lab test that is abnormal or we need to change your treatment, we will call you to review the results.   Testing/Procedures: NONE   Follow-Up: At [facility], you and your health needs are our priority.  As part of our continuing mission to provide you with exceptional heart care, we have created designated Provider Care Teams.  These Care Teams include your primary Cardiologist (physician) and Advanced Practice Providers (APPs -  Physician Assistants and Nurse Practitioners) who all work together to provide you with the care you need, when you need it.  We recommend signing up for the patient portal called "MyChart".  Sign up information is provided on this After Visit Summary.  MyChart is used to connect with patients for Virtual Visits (Telemedicine).  Patients are able to view lab/test results, encounter notes, upcoming appointments, etc.  Non-urgent messages can be sent to your provider as well.   To learn more about what you can do with MyChart, go to [email or web address].    Your next appointment:   1 year(s)  Provider:   DR. [facility]

## 2023-04-21 ENCOUNTER — Other Ambulatory Visit: Payer: Self-pay | Admitting: Internal Medicine

## 2023-04-21 DIAGNOSIS — Z1231 Encounter for screening mammogram for malignant neoplasm of breast: Secondary | ICD-10-CM

## 2023-05-09 ENCOUNTER — Ambulatory Visit
Admission: RE | Admit: 2023-05-09 | Discharge: 2023-05-09 | Disposition: A | Discharge status: Home / Self Care | Payer: Medicare Other | Source: Ambulatory Visit | Attending: Internal Medicine | Admitting: Internal Medicine

## 2023-05-09 DIAGNOSIS — Z1231 Encounter for screening mammogram for malignant neoplasm of breast: Secondary | ICD-10-CM

## 2023-05-22 ENCOUNTER — Ambulatory Visit (HOSPITAL_BASED_OUTPATIENT_CLINIC_OR_DEPARTMENT_OTHER)
Admission: RE | Admit: 2023-05-22 | Discharge: 2023-05-22 | Disposition: A | Discharge status: Home / Self Care | Payer: Medicare Other | Source: Ambulatory Visit | Attending: Family Medicine | Admitting: Family Medicine

## 2023-05-22 ENCOUNTER — Ambulatory Visit: Payer: Medicare Other | Admitting: Family Medicine

## 2023-05-22 VITALS — BP 112/78 | HR 78 | Ht 65.0 in

## 2023-05-22 DIAGNOSIS — M25521 Pain in right elbow: Secondary | ICD-10-CM | POA: Insufficient documentation

## 2023-05-22 DIAGNOSIS — M79631 Pain in right forearm: Secondary | ICD-10-CM | POA: Diagnosis present

## 2023-05-22 DIAGNOSIS — G8929 Other chronic pain: Secondary | ICD-10-CM

## 2023-05-22 NOTE — Progress Notes (Signed)
 CHIEF COMPLAINT: No chief complaint on file.  _____________________________________________________________ SUBJECTIVE  HPI  Pt is a 73 y.o. female here for evaluation of Right elbow pain going on for about a year, the pain comes and goes so quickly that she has not sleeked help, but now is starting to bother her more and sometimes last for longer periods of time. The pain is more aching in nature and will be like a quick sharp pain   Ongoing for 1 year Inciting event: nothing she can think of anything Primarily located  inner elbow/arm, lasting only a few seconds Radiating: yes up the bicep and down the forearm when the pain comes on Numbness/tingling: no Catching/locking no Exacerbated by full reach and bending Therapies tried so far: tylenol and advil if the pain lasts   ------------------------------------------------------------------------------------------------------ Past Medical History:  Diagnosis Date   Anemia    Anxiety    Depression    Elevated cholesterol    GERD (gastroesophageal reflux disease)    Hypertension    Psoriasis     Past Surgical History:  Procedure Laterality Date   finger injury Left 05/2012   fore finger cut opened(5 stitches)   TOTAL ABDOMINAL HYSTERECTOMY  1991   DUB-OV retained   TUBAL LIGATION  1978      Outpatient Encounter Medications as of 05/22/2023  Medication Sig Note   acetaminophen (TYLENOL) 500 MG tablet Take 1,000 mg by mouth as needed for moderate pain.    amLODipine (NORVASC) 10 MG tablet Take 10 mg by mouth daily.    Apremilast 30 MG TABS Take 30 mg by mouth 2 (two) times daily. 07/21/2022: LF 11/23. Pt is adamant she is still taking this medication. Dispense report does not support this claim.    Ascorbic Acid (VITAMIN C PO) Take 1 tablet by mouth daily.    atorvastatin (LIPITOR) 40 MG tablet Take 40 mg by mouth daily.    augmented betamethasone dipropionate (DIPROLENE-AF) 0.05 % cream Apply 1 Application topically 2 (two)  times daily as needed (psoriasis).    buPROPion (WELLBUTRIN XL) 150 MG 24 hr tablet Take 1 tablet (150 mg total) by mouth daily.    Calcium Carb-Cholecalciferol (CALCIUM + D3 PO) Take 1 tablet by mouth daily.    citalopram (CELEXA) 20 MG tablet Take 20 mg by mouth daily.    EPIPEN 2-PAK 0.3 MG/0.3ML SOAJ injection Inject 0.3 mg into the muscle as needed for anaphylaxis.    ibuprofen (ADVIL) 200 MG tablet Take 400-600 mg by mouth as needed for moderate pain.    loperamide (IMODIUM) 2 MG capsule Take 2 mg by mouth at bedtime.    LORATADINE ALLERGY RELIEF PO Take by mouth.    Melatonin 5 MG TABS Take 5 mg by mouth at bedtime.    Multiple Vitamins-Minerals (ONE DAILY WOMENS 50+ PO) Take 1 tablet by mouth daily.    Multiple Vitamins-Minerals (ZINC PO) Take 1 tablet by mouth daily.    pantoprazole (PROTONIX) 40 MG tablet Take 40 mg by mouth daily.    Probiotic Product (PROBIOTIC PO) Take 1 capsule by mouth at bedtime.    triamcinolone cream (KENALOG) 0.1 % Apply 1 Application topically as needed (psoriasis). 07/21/2022: Pt is unsure of last dose.    zaleplon (SONATA) 10 MG capsule Take 10 mg by mouth at bedtime as needed for sleep.    No facility-administered encounter medications on file as of 05/22/2023.    ------------------------------------------------------------------------------------------------------  _____________________________________________________________ OBJECTIVE  PHYSICAL EXAM  Today's Vitals   05/22/23 1408  BP: 112/78  Pulse: 78  SpO2: 98%  Height: 5\' 5"  (1.651 m)   Body mass index is 35.15 kg/m.   reviewed  General: A+Ox3, no acute distress, well-nourished, appropriate affect CV: pulses 2+ regular, nondiaphoretic, no peripheral edema, cap refill <2sec Lungs: no audible wheezing, non-labored breathing, bilateral chest rise/fall, nontachypneic Skin: warm, well-perfused, non-icteric, no susp lesions or rashes Neuro:  Sensation intact, muscle tone wnl, no  atrophy Psych: no signs of depression or anxiety MSK: no bony or muscular tenderness, patient reports having volar radial-sided pain when it comes on. full ROM elbow and wrist, strength b/l intact. Pain inconsistently reproduced with resisted and active supination/pronation of the wrist, light touch intact, negative cubital tinel's sign   _____________________________________________________________ ASSESSMENT/PLAN Diagnoses and all orders for this visit:  Right forearm pain -     DG ELBOW COMPLETE RIGHT (3+VIEW); Future -     Ambulatory referral to Physical Therapy  Chronic elbow pain, right -     DG ELBOW COMPLETE RIGHT (3+VIEW); Future -     Ambulatory referral to Physical Therapy   Unremarkable exam today, has been pain free for the last few days. Differential includes OA, tendinosis. Discussed options for management. XR ordered for further evaluation. May utilize otc pain mgmt as needed including heat therapy. PT referral placed. All questions answered. Return precautions discussed. Patient verbalized understanding and is in agreement with plan. Anticipate follow-up 4-6 weeks, sooner as needed for refractory, worsening, or new symptoms.   Electronically signed by: Burna Forts, MD 05/22/2023 1:03 PM

## 2023-12-08 IMAGING — MG MM DIGITAL DIAGNOSTIC UNILAT*R* W/ TOMO W/ CAD
8 series · 8 of 24 positions shown · non-contrast
Comparison: Previous exams.

CLINICAL DATA: 70-year-old female with palpable area of concern in
the far upper outer right breast/right axillary tail.

EXAM:
DIGITAL DIAGNOSTIC UNILATERAL RIGHT MAMMOGRAM WITH TOMOSYNTHESIS AND
CAD; ULTRASOUND RIGHT BREAST LIMITED
TECHNIQUE: Right digital diagnostic mammography and breast tomosynthesis was
performed. The images were evaluated with computer-aided detection.;
Targeted ultrasound examination of the right breast was performed

[R CC synth-2D]
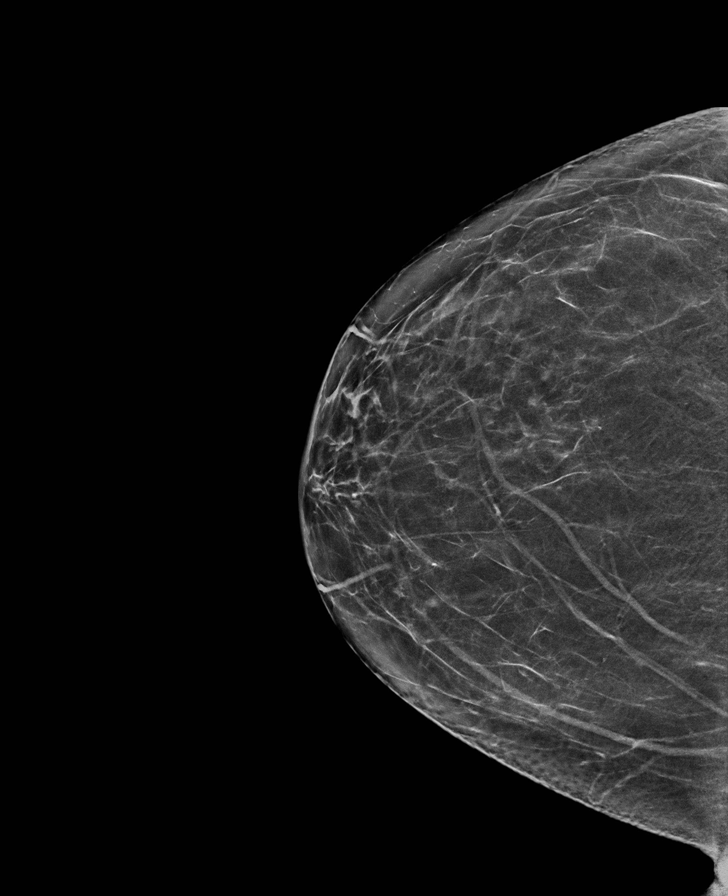

[R XCCL synth-2D]
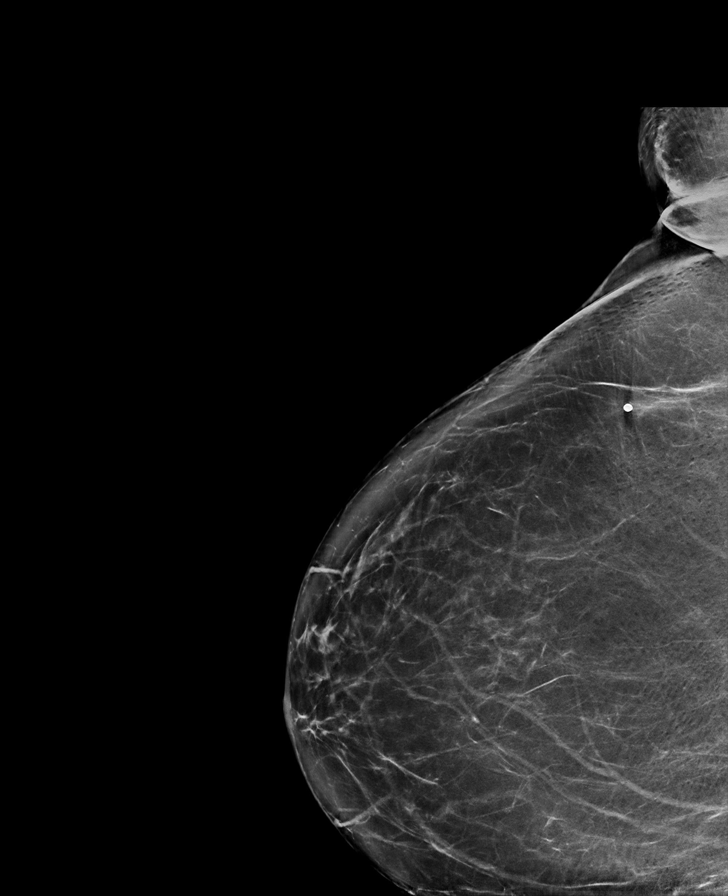

[R MLO synth-2D]
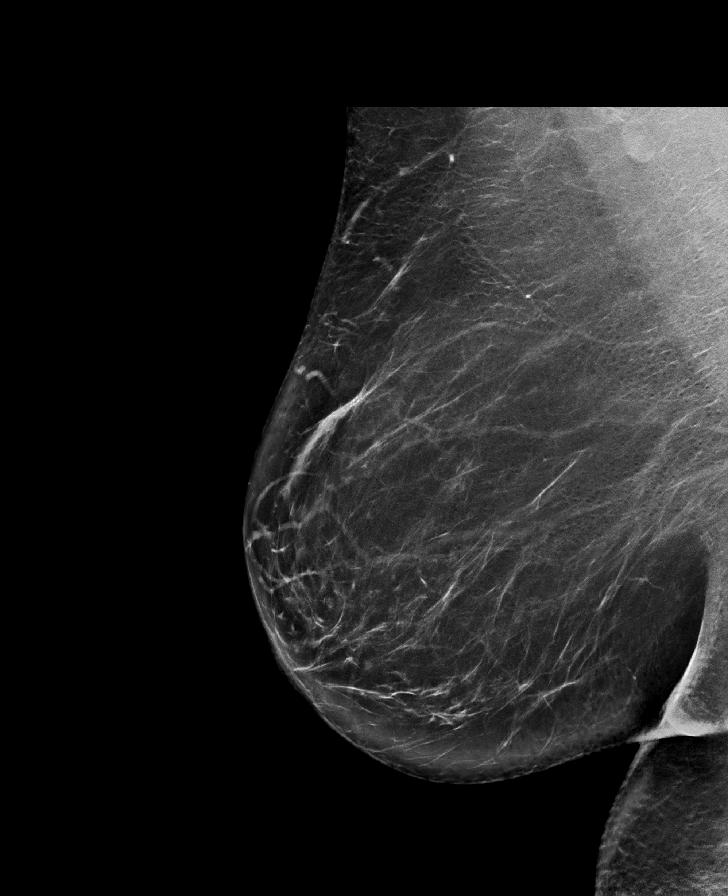

[R TAN synth-2D]
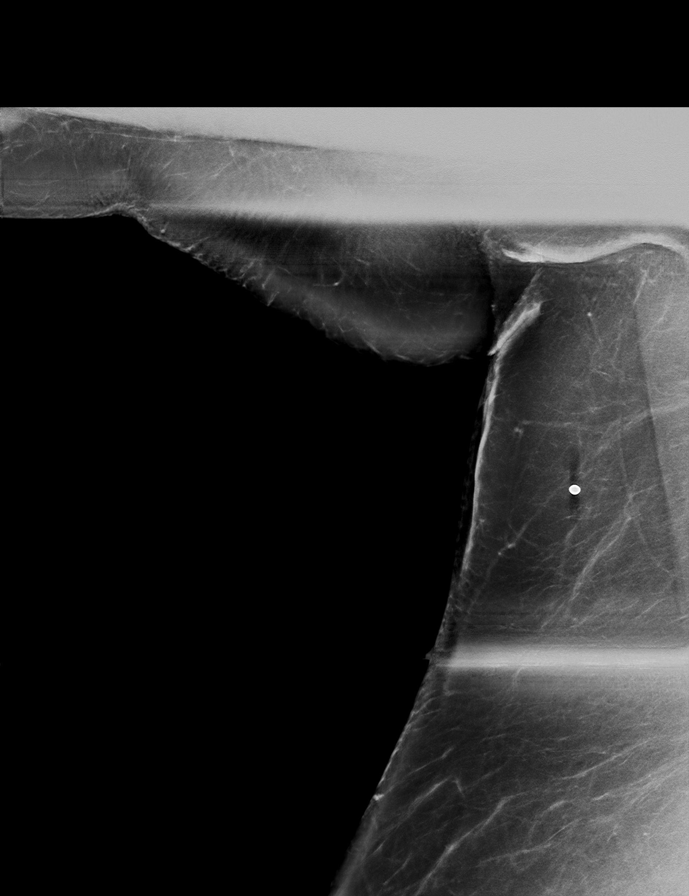

[R MLO tomo · tomo slice 47/94.0]
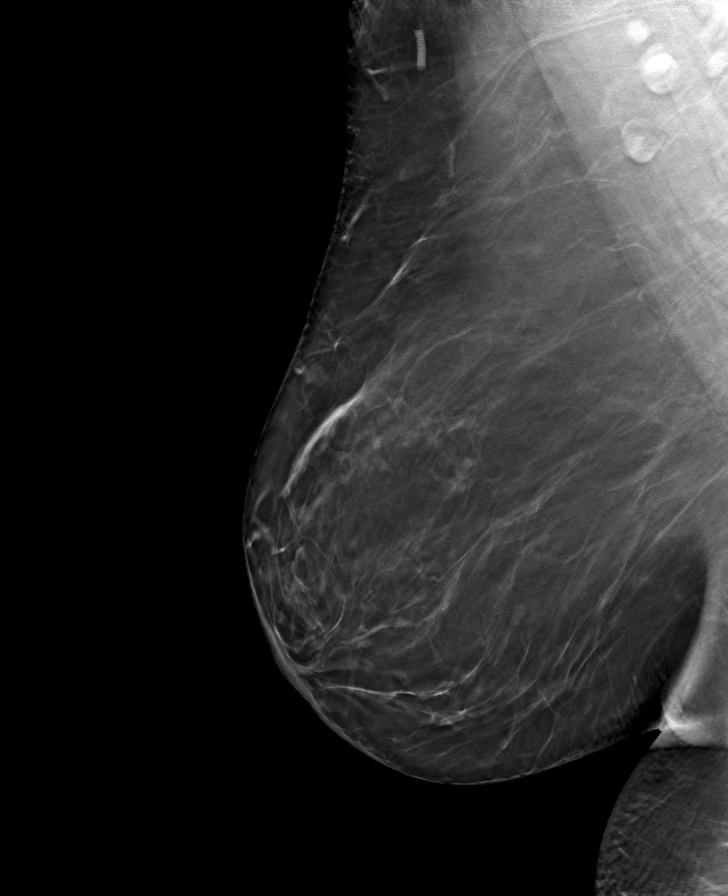

[R XCCL tomo · tomo slice 43/85.0]
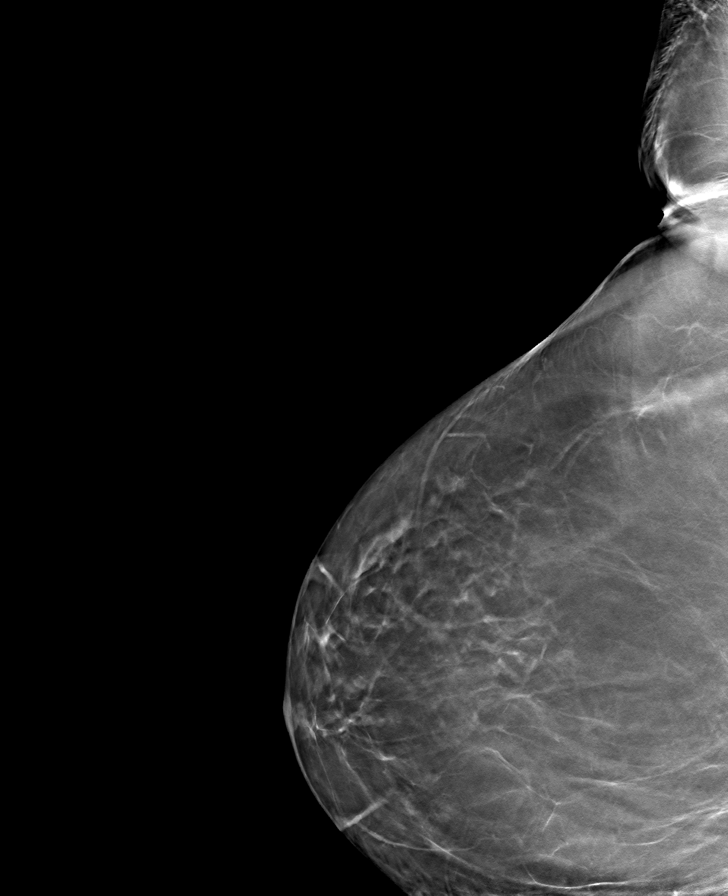

[R TAN tomo · tomo slice 44/87.0]
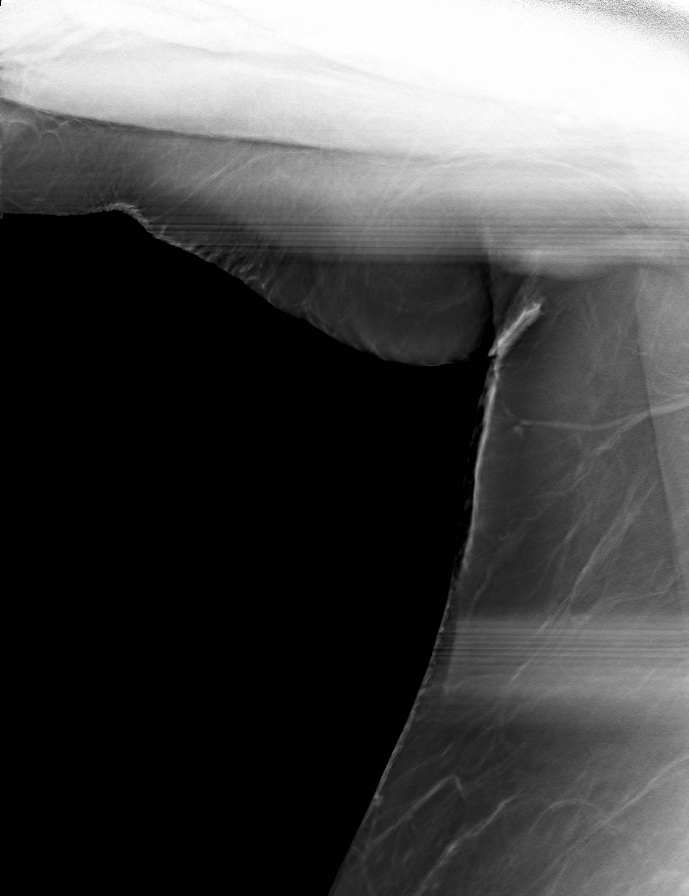

[R CC tomo · tomo slice 37/73.0]
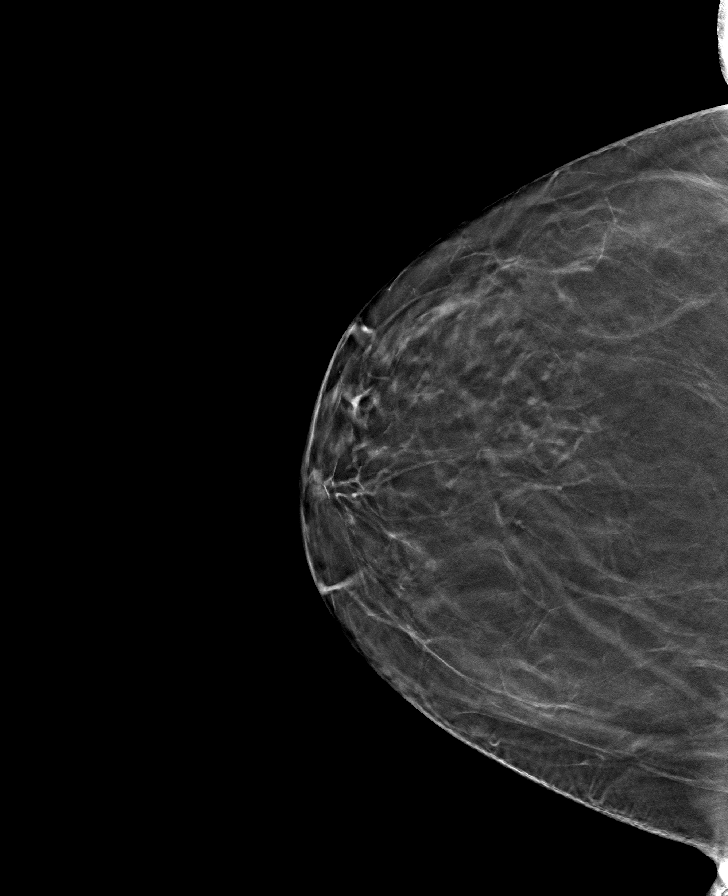

[8 of 24 positions shown; findings below may reference images not displayed]

ACR Breast Density Category b: There are scattered areas of
fibroglandular density.
FINDINGS: No suspicious masses or calcifications seen in the right breast.
Spot compression tomograms were performed over the palpable area of
concern in the far upper outer right breast/right axillary tail with
no definite abnormality seen.

Physical examination at site of palpable concern in the far upper
slightly outer right breast reveals a soft slightly mobile mass.

Targeted ultrasound of the right breast was performed. There is an
oval circumscribed near isoechoic mass in the right breast at 12
o'clock 15 cm from nipple measuring 3.8 x 1.3 x 2.9 cm. Findings are
most consistent with a benign lipoma.
IMPRESSION: Lipoma at site of palpable concern in the right breast. No
mammographic evidence of malignancy.

RECOMMENDATION:
Recommend annual screening mammography, due October 2021.

I have discussed the findings and recommendations with the patient.
If applicable, a reminder letter will be sent to the patient
regarding the next appointment.

BI-RADS CATEGORY  2: Benign.

## 2023-12-24 ENCOUNTER — Ambulatory Visit: Admitting: Physician Assistant

## 2023-12-28 NOTE — Progress Notes (Unsigned)
 Cardiology Office Note:  .   Date:  12/30/2023  ID:  Nicole Richmond, DOB [birth year], MRN [id number] PCP: Nicole Nottingham, MD  [facility] Providers Cardiologist:  Nicole Clay, MD {  History of Present Illness: .   Nicole Richmond is a 73 y.o. female with history of hypertension, hyperlipidemia, aortic sclerosis       Social history  Father had multiple Mis. Brother and father MIs in their 20s No drugs or alcohol.  Previous smoker 20-pack-year history.  Quit in 2000.    Patient has family history of early CAD.  We had seen patient 1 time 12/2022 after hospital admission where she had been complaining of chest pressure.  She was asymptomatic at follow-up.  Echocardiogram was preserved.  Aortic sclerosis noted.  Consideration of stress testing if symptoms reoccurred.  Today patient presents for annual follow-up.  She reports no further complaints of chest pain but she does say for the past 9 months or so she has been noticing more shortness of breath and fatigue with activity.  This is new for her.  Normally she walks about 6000 steps each day but has noticed that this has become a little bit more challenging.  Otherwise without any other acute complaints.  She is retired and takes care of her 104+-year-old mother along with her sister. She's inquiring about weight loss drugs.     ROS: Denies: Chest pain, shortness of breath, orthopnea, peripheral edema, palpitations, decreased exercise intolerance, fatigue, lightheadedness.   Studies Reviewed: SABRA    EKG Interpretation Date/Time:  Tuesday December 30 2023 10:18:02 EDT Ventricular Rate:  85 PR Interval:  178 QRS Duration:  82 QT Interval:  360 QTC Calculation: 428 R Axis:   -20  Text Interpretation: Normal sinus rhythm Inferior infarct (cited on or before 20-Dec-2022) Anterior infarct (cited on or before 20-Dec-2022) When compared with ECG of 20-Dec-2022 14:41, No significant change was found Confirmed by Nicole Richmond [id number]) on 12/30/2023 10:20:41 AM    Risk Assessment/Calculations:             Physical Exam:   VS:  BP 114/64   Pulse 85   Ht 5' 5 (1.651 m)   Wt 209 lb 12.8 oz (95.2 kg)   LMP 04/08/1989   SpO2 97%   BMI 34.91 kg/m    Wt Readings from Last 3 Encounters:  12/30/23 209 lb 12.8 oz (95.2 kg)  12/20/22 211 lb 3.2 oz (95.8 kg)  07/20/22 199 lb 1.6 oz (90.3 kg)    GEN: Well nourished, well developed in no acute distress NECK: No JVD; No carotid bruits CARDIAC: RRR, no murmurs, rubs, gallops RESPIRATORY:  Clear to auscultation without rales, wheezing or rhonchi  ABDOMEN: Soft, non-tender, non-distended EXTREMITIES:  No edema; No deformity   ASSESSMENT AND PLAN: .    Premature FH of CAD Strong family history with MIs early in multiple family members at the age of 49.  Her risk factors are previous smoker, hypertension, hyperlipidemia.  She is presenting with worsening complaints of fatigue, shortness of breath but no chest pain.  EKG with no acute ST-T wave changes. Will plan for coronary CTA for further restratification Continue with atorvastatin  40 mg.  LDL well-controlled.  04/2023 78.  Will adjust goals after coronary CTA. Give 1 dose of metoprolol  100 mg prior to CT. She has no allergies to contrast.  Get BMP.  Aortic sclerosis Noted on echocardiogram last year.  With her worsening shortness of breath want to rule  out further progression.  Repeat echocardiogram  Former smoker 20+ pack year history.  I think she would benefit from lung cancer screening.  Defer to PCP.  Hypertension Well-controlled.  Continue amlodipine  10 mg daily.    Dispo: 4 months to establish care with Dr. Anner.   Signed, Nicole LITTIE Sluder, PA-C

## 2023-12-30 ENCOUNTER — Ambulatory Visit: Attending: Physician Assistant | Admitting: Cardiology

## 2023-12-30 ENCOUNTER — Encounter: Payer: Self-pay | Admitting: Physician Assistant

## 2023-12-30 VITALS — BP 114/64 | HR 85 | Ht 65.0 in | Wt 209.8 lb

## 2023-12-30 DIAGNOSIS — I7090 Unspecified atherosclerosis: Secondary | ICD-10-CM

## 2023-12-30 DIAGNOSIS — R0609 Other forms of dyspnea: Secondary | ICD-10-CM | POA: Diagnosis not present

## 2023-12-30 DIAGNOSIS — I10 Essential (primary) hypertension: Secondary | ICD-10-CM

## 2023-12-30 DIAGNOSIS — Z8249 Family history of ischemic heart disease and other diseases of the circulatory system: Secondary | ICD-10-CM

## 2023-12-30 MED ORDER — METOPROLOL TARTRATE 100 MG PO TABS: ORAL_TABLET | ORAL | 0 refills | Status: DC

## 2023-12-30 NOTE — Patient Instructions (Addendum)
 Medication Instructions:  Your physician recommends that you continue on your current medications as directed. Please refer to the Current Medication list given to you today.  *If you need a refill on your cardiac medications before your next appointment, please call your pharmacy*  Lab Work: TODAY- BMET If you have labs (blood work) drawn today and your tests are completely normal, you will receive your results only by: MyChart Message (if you have MyChart) OR A paper copy in the mail If you have any lab test that is abnormal or we need to change your treatment, we will call you to review the results.  Testing/Procedures: Your physician has requested that you have an echocardiogram. Echocardiography is a painless test that uses sound waves to create images of your heart. It provides your doctor with information about the size and shape of your heart and how well your heart's chambers and valves are working. This procedure takes approximately one hour. There are no restrictions for this procedure. Please do NOT wear cologne, perfume, aftershave, or lotions (deodorant is allowed). Please arrive 15 minutes prior to your appointment time.  Please note: We ask at that you not bring children with you during ultrasound (echo/ vascular) testing. Due to room size and safety concerns, children are not allowed in the ultrasound rooms during exams. Our front office staff cannot provide observation of children in our lobby area while testing is being conducted. An adult accompanying a patient to their appointment will only be allowed in the ultrasound room at the discretion of the ultrasound technician under special circumstances. We apologize for any inconvenience.       Your cardiac CT will be scheduled at one of the below locations:    [facility] [address]  [place], [place] [address] [phone]  If scheduled at the Heart and Vascular Tower at [facility]  street, please enter the parking lot using the [address] entrance and use the FREE valet service at the patient drop-off area. Enter the building and check-in with registration on the main floor.  Please follow these instructions carefully (unless otherwise directed):  An IV will be required for this test and Nitroglycerin  will be given.  Hold all erectile dysfunction medications at least 3 days (72 hrs) prior to test. (Ie viagra, cialis, sildenafil, tadalafil, etc)   On the Night Before the Test: Be sure to Drink plenty of water. Do not consume any caffeinated/decaffeinated beverages or chocolate 12 hours prior to your test. Do not take any antihistamines 12 hours prior to your test.  If the patient has contrast allergy: Patient will need a prescription for Prednisone and very clear instructions (as follows): Prednisone 50 mg - take 13 hours prior to test Take another Prednisone 50 mg 7 hours prior to test Take another Prednisone 50 mg 1 hour prior to test Take Benadryl 50 mg 1 hour prior to test Patient must complete all four doses of above prophylactic medications. Patient will need a ride after test due to Benadryl.  On the Day of the Test: Drink plenty of water until 1 hour prior to the test. Do not eat any food 1 hour prior to test. You may take your regular medications prior to the test.  Take metoprolol  (Lopressor ) two hours prior to test. If you take Furosemide/Hydrochlorothiazide/Spironolactone/Chlorthalidone, please HOLD on the morning of the test. Patients who wear a continuous glucose monitor MUST remove the device prior to scanning. FEMALES- please wear underwire-free bra if available,  avoid dresses & tight clothing      After the Test: Drink plenty of water. After receiving IV contrast, you may experience a mild flushed feeling. This is normal. On occasion, you may experience a mild rash up to 24 hours after the test. This is not dangerous. If this occurs, you  can take Benadryl 25 mg, Zyrtec, Claritin, or Allegra and increase your fluid intake. (Patients taking Tikosyn should avoid Benadryl, and may take Zyrtec, Claritin, or Allegra) If you experience trouble breathing, this can be serious. If it is severe call [phone] IMMEDIATELY. If it is mild, please call our office.  We will call to schedule your test 2-4 weeks out understanding that some insurance companies will need an authorization prior to the service being performed.   For more information and frequently asked questions, please visit our website : [email or web address]  For non-scheduling related questions, please contact the cardiac imaging nurse navigator should you have any questions/concerns: Cardiac Imaging Nurse Navigators Direct Office Dial: [phone]   For scheduling needs, including cancellations and rescheduling, please call [place], [phone].   Follow-Up: At [facility], you and your health needs are our priority.  As part of our continuing mission to provide you with exceptional heart care, our providers are all part of one team.  This team includes your primary Cardiologist (physician) and Advanced Practice Providers or APPs (Physician Assistants and Nurse Practitioners) who all work together to provide you with the care you need, when you need it.  Your next appointment:   4 month(s)  Provider:   Alm Clay, MD

## 2024-01-12 ENCOUNTER — Encounter (HOSPITAL_COMMUNITY): Payer: Self-pay

## 2024-01-14 ENCOUNTER — Ambulatory Visit (HOSPITAL_BASED_OUTPATIENT_CLINIC_OR_DEPARTMENT_OTHER)
Admission: RE | Admit: 2024-01-14 | Discharge: 2024-01-14 | Disposition: A | Discharge status: Home / Self Care | Source: Ambulatory Visit | Attending: Cardiology | Admitting: Cardiology

## 2024-01-14 ENCOUNTER — Ambulatory Visit (HOSPITAL_COMMUNITY)
Admission: RE | Admit: 2024-01-14 | Discharge: 2024-01-14 | Disposition: A | Discharge status: Home / Self Care | Source: Ambulatory Visit | Attending: Cardiology | Admitting: Cardiology

## 2024-01-14 ENCOUNTER — Ambulatory Visit: Payer: Self-pay | Admitting: Physician Assistant

## 2024-01-14 ENCOUNTER — Other Ambulatory Visit: Payer: Self-pay | Admitting: Cardiology

## 2024-01-14 DIAGNOSIS — I2510 Atherosclerotic heart disease of native coronary artery without angina pectoris: Secondary | ICD-10-CM | POA: Insufficient documentation

## 2024-01-14 DIAGNOSIS — R931 Abnormal findings on diagnostic imaging of heart and coronary circulation: Secondary | ICD-10-CM | POA: Insufficient documentation

## 2024-01-14 DIAGNOSIS — R0609 Other forms of dyspnea: Secondary | ICD-10-CM | POA: Insufficient documentation

## 2024-01-14 MED ORDER — IOHEXOL 350 MG/ML SOLN
100.0000 mL | Freq: Once | INTRAVENOUS | Status: AC | PRN
  Administered 2024-01-14: 100 mL via INTRAVENOUS

## 2024-01-14 MED ORDER — NITROGLYCERIN 0.4 MG SL SUBL
0.8000 mg | SUBLINGUAL_TABLET | Freq: Once | SUBLINGUAL | Status: AC
Start: 2024-01-14 — End: 2024-01-14
  Administered 2024-01-14: 0.8 mg via SUBLINGUAL

## 2024-01-15 NOTE — Progress Notes (Signed)
 Yes, absolutely. Any of Thursday's open slots are fine.

## 2024-01-20 ENCOUNTER — Ambulatory Visit: Payer: Self-pay | Admitting: Cardiology

## 2024-01-22 ENCOUNTER — Encounter: Payer: Self-pay | Admitting: Physician Assistant

## 2024-01-22 ENCOUNTER — Ambulatory Visit: Attending: Cardiology | Admitting: Physician Assistant

## 2024-01-22 VITALS — BP 130/78 | HR 37 | Ht 65.0 in | Wt 208.8 lb

## 2024-01-22 DIAGNOSIS — R011 Cardiac murmur, unspecified: Secondary | ICD-10-CM | POA: Diagnosis not present

## 2024-01-22 DIAGNOSIS — E785 Hyperlipidemia, unspecified: Secondary | ICD-10-CM

## 2024-01-22 DIAGNOSIS — I498 Other specified cardiac arrhythmias: Secondary | ICD-10-CM | POA: Diagnosis not present

## 2024-01-22 DIAGNOSIS — Z8249 Family history of ischemic heart disease and other diseases of the circulatory system: Secondary | ICD-10-CM

## 2024-01-22 DIAGNOSIS — I2510 Atherosclerotic heart disease of native coronary artery without angina pectoris: Secondary | ICD-10-CM

## 2024-01-22 DIAGNOSIS — I10 Essential (primary) hypertension: Secondary | ICD-10-CM

## 2024-01-22 MED ORDER — METOPROLOL TARTRATE 25 MG PO TABS: ORAL_TABLET | ORAL | 1 refills | Status: AC

## 2024-01-22 NOTE — Patient Instructions (Addendum)
 Thank you for choosing [facility]!     Medication Instructions:  Start the Metoprolol  25mg . Take one half tablet in the morning. THEN, take the other half tablet at bedtime.  *If you need a refill on your cardiac medications before your next appointment, please call your pharmacy*   Lab Work: No labs were ordered during today's visit.  If you have labs (blood work) drawn today and your tests are completely normal, you will receive your results only by: MyChart Message (if you have MyChart) OR A paper copy in the mail If you have any lab test that is abnormal or we need to change your treatment, we will call you to review the results.   Testing/Procedures: No procedures were ordered during today's visit.   Your next appointment:   4 month(s)   Provider:   Alm Clay, MD     Follow-Up: At [facility], you and your health needs are our priority.  As part of our continuing mission to provide you with exceptional heart care, we have created designated Provider Care Teams.  These Care Teams include your primary Cardiologist (physician) and Advanced Practice Providers (APPs -  Physician Assistants and Nurse Practitioners) who all work together to provide you with the care you need, when you need it. We recommend signing up for the patient portal called MyChart.  Sign up information is provided on this After Visit Summary.  MyChart is used to connect with patients for Virtual Visits (Telemedicine).  Patients are able to view lab/test results, encounter notes, upcoming appointments, etc.  Non-urgent messages can be sent to your provider as well.   To learn more about what you can do with MyChart, go to [email or web address].

## 2024-01-22 NOTE — Progress Notes (Signed)
 Cardiology Office Note   Date:  01/23/2024  ID:  Arlyne Richmond Ada, DOB December 23, 1950, MRN [id number] PCP: Clarice Nottingham, MD  [facility] Providers Cardiologist:  Alm Clay, MD     History of Present Illness Nicole Richmond is a 73 y.o. female with history of hypertension, hyperlipidemia and aortic atherosclerosis.  She has family history of early CAD.  She was seen in the hospital in September 2024 with chest pain.  Echocardiogram obtained on 07/21/2022 showed EF 60 to 65%, grade 1 DD, trivial MR.  She was recently seen by Thom Sluder PA-C on 12/2021/2025 at which time she was complaining of shortness of breath and fatigue with activity which is new for her.  Echocardiogram and a coronary CTA was recommended.  Coronary CTA obtained on 01/14/2024 showed 70 to 99% ostial RCA lesion however this is small nondominant vessel, otherwise nonobstructive disease in other vessels.  Medical therapy and hydration was recommended.  Patient presents today for follow-up.  She appears to be somewhat upset.  She mention she is under increased stress as her mother was just checked into a nursing home.  She and her sister are trying to visit her to keep her company.  Her initial pulse rate was 37 bpm, subsequent EKG shows she is in ventricular bigeminy.  I started her on metoprolol  tartrate 12.5 mg twice a day for PVC suppression.  I offered to do a 3-day heart monitor to assess her PVC burden, however she declined.  She does not have any significant symptom such as shortness of breath, dizziness or feeling of passing out.  She is due for her echocardiogram near the end of this month.  On exam, she clearly has aortic valve murmur that 3 out of 6 intensity.  She had mild AI on the previous echocardiogram.  She is taking a low-dose aspirin.  She can follow-up with Dr. Clay in 4 months for reassessment.  ROS:   She denies chest pain, palpitations, dyspnea, pnd, orthopnea, n, v, dizziness, syncope, edema,  weight gain, or early satiety. All other systems reviewed and are otherwise negative except as noted above.    Studies Reviewed EKG Interpretation Date/Time:  Thursday January 22 2024 09:39:26 EDT Ventricular Rate:  75 PR Interval:  184 QRS Duration:  80 QT Interval:  416 QTC Calculation: 464 R Axis:   8  Text Interpretation: Sinus rhythm with frequent Premature ventricular complexes in a pattern of bigeminy When compared with ECG of 30-Dec-2023 10:18, Premature ventricular complexes are now Present Criteria for Anterior infarct are no longer Present Criteria for Inferior infarct are no longer Present Confirmed by Janene Boer [id number]) on 01/22/2024 9:40:43 AM    Cardiac Studies & Procedures   ______________________________________________________________________________________________     ECHOCARDIOGRAM  ECHOCARDIOGRAM COMPLETE 07/21/2022  Narrative ECHOCARDIOGRAM REPORT    Patient Name:   ARLYNE Richmond ADA Date of Exam: 07/21/2022 Medical Rec #:  [id number]            Height:       65.0 in Accession #:    [id number]           Weight:       199.1 lb Date of Birth:  Aug 24, 1950             BSA:          1.974 m Patient Age:    72 years             BP:  125/79 mmHg Patient Gender: F                    HR:           86 bpm. Exam Location:  Inpatient  Procedure: 2D Echo, Cardiac Doppler and Color Doppler  Indications:    Chest Pain R07.9  History:        Patient has prior history of Echocardiogram examinations, most recent 03/29/2014. Risk Factors:Hypertension and Dyslipidemia. GERD (gastroesophageal reflux disease).  Sonographer:    Aida Pizza RCS Referring Phys: [id number] CURTIS J WOODS  IMPRESSIONS   1. Left ventricular ejection fraction, by estimation, is 60 to 65%. Left ventricular ejection fraction by PLAX is 64 %. The left ventricle has normal function. The left ventricle has no regional wall motion abnormalities. Left ventricular diastolic parameters are  consistent with Grade I diastolic dysfunction (impaired relaxation). 2. Right ventricular systolic function is normal. The right ventricular size is normal. 3. The mitral valve is normal in structure. Trivial mitral valve regurgitation. 4. The aortic valve is normal in structure. Aortic valve regurgitation is mild. Aortic valve sclerosis is present, with no evidence of aortic valve stenosis.  FINDINGS Left Ventricle: Left ventricular ejection fraction, by estimation, is 60 to 65%. Left ventricular ejection fraction by PLAX is 64 %. The left ventricle has normal function. The left ventricle has no regional wall motion abnormalities. The left ventricular internal cavity size was normal in size. There is no left ventricular hypertrophy. Left ventricular diastolic parameters are consistent with Grade I diastolic dysfunction (impaired relaxation).  Right Ventricle: The right ventricular size is normal. No increase in right ventricular wall thickness. Right ventricular systolic function is normal.  Left Atrium: Left atrial size was normal in size.  Right Atrium: Right atrial size was normal in size.  Pericardium: There is no evidence of pericardial effusion.  Mitral Valve: The mitral valve is normal in structure. Trivial mitral valve regurgitation.  Tricuspid Valve: The tricuspid valve is normal in structure. Tricuspid valve regurgitation is trivial.  Aortic Valve: The aortic valve is normal in structure. Aortic valve regurgitation is mild. Aortic regurgitation PHT measures 507 msec. Aortic valve sclerosis is present, with no evidence of aortic valve stenosis. Aortic valve mean gradient measures 11.0 mmHg. Aortic valve peak gradient measures 21.5 mmHg. Aortic valve area, by VTI measures 1.34 cm.  Pulmonic Valve: The pulmonic valve was normal in structure. Pulmonic valve regurgitation is not visualized.  Aorta: The aortic root is normal in size and structure.  IAS/Shunts: No atrial level shunt  detected by color flow Doppler.   LEFT VENTRICLE PLAX 2D LV EF:         Left            Diastology ventricular     LV e' medial:    5.91 cm/s ejection        LV E/e' medial:  11.2 fraction by     LV e' lateral:   9.64 cm/s PLAX is 64      LV E/e' lateral: 6.9 %. LVIDd:         4.90 cm LVIDs:         3.20 cm LV PW:         1.20 cm LV IVS:        1.10 cm LVOT diam:     1.70 cm LV SV:         62 LV SV Index:   31 LVOT Area:  2.27 cm   RIGHT VENTRICLE RV S prime:     14.40 cm/s TAPSE (M-mode): 2.3 cm  LEFT ATRIUM             Index        RIGHT ATRIUM           Index LA diam:        3.70 cm 1.87 cm/m   RA Area:     20.30 cm LA Vol (A2C):   82.7 ml 41.89 ml/m  RA Volume:   68.70 ml  34.80 ml/m LA Vol (A4C):   77.7 ml 39.35 ml/m LA Biplane Vol: 86.0 ml 43.56 ml/m AORTIC VALVE AV Area (Vmax):    1.19 cm AV Area (Vmean):   1.21 cm AV Area (VTI):     1.34 cm AV Vmax:           232.00 cm/s AV Vmean:          152.000 cm/s AV VTI:            0.463 m AV Peak Grad:      21.5 mmHg AV Mean Grad:      11.0 mmHg LVOT Vmax:         121.50 cm/s LVOT Vmean:        81.250 cm/s LVOT VTI:          0.272 m LVOT/AV VTI ratio: 0.59 AI PHT:            507 msec  AORTA Ao Root diam: 3.30 cm  MITRAL VALVE MV Area (PHT): 3.27 cm    SHUNTS MV Decel Time: 232 msec    Systemic VTI:  0.27 m MV E velocity: 66.20 cm/s  Systemic Diam: 1.70 cm MV A velocity: 91.20 cm/s MV E/A ratio:  0.73  Sabina Custovic Electronically signed by Annalee Casa Signature Date/Time: 07/21/2022/2:53:34 PM    Final      CT SCANS  CT CORONARY MORPH W/CTA COR W/SCORE 01/14/2024  Narrative CLINICAL DATA:  Dyspnea on exertion (DOE)  EXAM: Cardiac/Coronary CTA  TECHNIQUE: A non-contrast, gated CT scan was obtained with axial slices of 2.5 mm through the heart for calcium  scoring. Calcium  scoring was performed using the Agatston method. A 120 kV prospective, gated, contrast cardiac CT scan  was obtained. Gantry rotation speed was 230 msec and collimation was 0.63 mm. Two sublingual nitroglycerin  tablets (0.8 mg) were given. The 3D data set was reconstructed with motion correction for the best systolic or diastolic phase. Images were analyzed on a dedicated workstation using MPR, MIP, and VRT modes. The patient received 100mL OMNIPAQUE IOHEXOL 350 MG/ML SOLN cc of contrast.  FINDINGS: Image quality: Excellent.  Noise artifact is: Limited.  Calcium  score: Calcium  score is 561, which is 90th percentile.  Coronary Arteries:  Normal coronary origin.  Left dominance.  Left main: Normal caliber vessel. Mixed calcified and noncalcified plaque with 1-24% stenosis.  Left anterior descending artery: Normal caliber vessel. Diffuse mixed calcified and noncalcified plaque throughout proximal and mid vessel, with several focal areas of 25-49% stenosis. The LAD gives off large first, medium second diagonal branches. D1 has ostial mixed calcified and noncalcified plaque with 25-49% stenosis. D2 has ostial mixed calcified and noncalcified plaque with 1-24% stenosis.  Left circumflex artery: Normal caliber, gives rise to the PDA. Two areas of predominantly calcified plaque in proximal vessel with 1-24% stenosis. Focal mixed plaque in mid vessel with 1-24% stenosis. The LCX gives off medium first, small second, small third obtuse marginal  branches.  Right coronary artery: Small caliber, non-dominant. Ostial RCA with predominantly noncalcified plaque in the vessel and calcification in the sinus of Valsalva, with 70-99% stenosis. Mid vessel with calcified plaque and 1-24% stenosis.  Right Atrium: Right atrial size is visually normal.  Right Ventricle: The right ventricular cavity is visually normal.  Left Atrium: Left atrial size is visually normal with no left atrial appendage filling defect.  Left Ventricle: The ventricular cavity size is visually normal.  Pulmonary arteries:  Normal in size.  Pulmonary veins: Normal pulmonary venous drainage.  Pericardium: Normal thickness without significant effusion or calcium  present.  Cardiac valves: The aortic valve is trileaflet with calcification The mitral valve is normal without significant calcification.  Aorta: Normal caliber with aortic atherosclerosis.  Extra-cardiac findings: See attached radiology report for non-cardiac structures.  IMPRESSION: 1. Coronary disease with severe stenosis, CADRADS=4. CT-FFR will be performed and reported separately. Ostial RCA with visual 70-99% stenosis, though this is small/nondominant vessel. Otherwise nonobstructive disease.  2. Coronary calcium  score of 561. This was 90th percentile for age-, sex, and race-matched controls.  3. Normal coronary origin with left dominance.  Findings communicated to Scot Ford (covering for [facility]) and Dr. Alm Clay.  RECOMMENDATIONS: CAD-RADS 4: Severe stenosis. (70-99% or > 50% left main). Cardiac catheterization or CT FFR is recommended. Consider symptom-guided anti-ischemic pharmacotherapy as well as risk factor modification per guideline directed care.  Shelda Bruckner, MD   Electronically Signed By: Shelda Bruckner M.D. On: 01/14/2024 15:33     ______________________________________________________________________________________________      Risk Assessment/Calculations           Physical Exam VS:  BP 130/78   Pulse (!) 37   Ht 5' 5 (1.651 m)   Wt 208 lb 12.8 oz (94.7 kg)   LMP 04/08/1989   SpO2 97%   BMI 34.75 kg/m        Wt Readings from Last 3 Encounters:  01/22/24 208 lb 12.8 oz (94.7 kg)  12/30/23 209 lb 12.8 oz (95.2 kg)  12/20/22 211 lb 3.2 oz (95.8 kg)    GEN: Well nourished, well developed in no acute distress NECK: No JVD; No carotid bruits CARDIAC: RRR, no rubs, gallops.   3/6 systolic heart murmur RESPIRATORY:  Clear to auscultation without rales, wheezing or rhonchi   ABDOMEN: Soft, non-tender, non-distended EXTREMITIES:  No edema; No deformity   ASSESSMENT AND PLAN  CAD: Recent coronary CTA showed significant ostial RCA disease, however RCA is a small nondominant artery.  Medical therapy was recommended.  Patient denies any chest pain today.  I recommended start her on 12.5 mg twice a day of metoprolol .  Heart murmur: She has significant aortic valve murmur, echocardiogram has already been scheduled for the end of this month.  I explained the reasoning to proceed with echocardiogram  Ventricular bigeminy: Initial heart rate was 37 bpm, EKG shows her heart rate is actually in the 70s however she is in ventricular bigeminy.  I offered her a 3-day heart monitor, however she declined.  She eventually agreed to start on a 12.5 mg twice a day of metoprolol  tartrate for PVC per suppression and antianginal purpose  Hypertension: Blood pressure stable  Hyperlipidemia: On atorvastatin  40 mg daily.  LDL goal less than 70        Dispo: Follow-up with Dr. Clay in 4 months.  Signed, Scot Ford, PA

## 2024-02-03 ENCOUNTER — Telehealth (HOSPITAL_COMMUNITY): Payer: Self-pay | Admitting: Cardiology

## 2024-02-03 NOTE — Telephone Encounter (Signed)
 Patient cancelled echocardiogram for reason below:  02/03/2024 8:45 AM Ab:Nicole Richmond, Nicole Richmond  Cancel Rsn: Patient (pt cancelled and did not wish to reschedule)   Order will be removed from the active echo WQ. Thank you.

## 2024-02-05 ENCOUNTER — Ambulatory Visit (HOSPITAL_COMMUNITY)

## 2024-02-19 NOTE — Telephone Encounter (Signed)
 Patient stated she had a change of heart and wants to get her order for the echocardiogram reinstated.

## 2024-03-10 ENCOUNTER — Other Ambulatory Visit: Payer: Self-pay | Admitting: Internal Medicine

## 2024-03-10 DIAGNOSIS — Z1231 Encounter for screening mammogram for malignant neoplasm of breast: Secondary | ICD-10-CM

## 2024-03-26 ENCOUNTER — Encounter: Payer: Self-pay | Admitting: Cardiology

## 2024-03-30 ENCOUNTER — Ambulatory Visit (HOSPITAL_COMMUNITY)

## 2024-04-14 ENCOUNTER — Ambulatory Visit (HOSPITAL_COMMUNITY)
Admission: RE | Admit: 2024-04-14 | Discharge: 2024-04-14 | Disposition: A | Discharge status: Home / Self Care | Payer: Self-pay | Source: Ambulatory Visit | Attending: Internal Medicine | Admitting: Internal Medicine

## 2024-04-14 DIAGNOSIS — I7090 Unspecified atherosclerosis: Secondary | ICD-10-CM | POA: Diagnosis not present

## 2024-04-14 DIAGNOSIS — R079 Chest pain, unspecified: Secondary | ICD-10-CM | POA: Diagnosis not present

## 2024-04-14 DIAGNOSIS — Z87891 Personal history of nicotine dependence: Secondary | ICD-10-CM | POA: Insufficient documentation

## 2024-04-14 DIAGNOSIS — E785 Hyperlipidemia, unspecified: Secondary | ICD-10-CM | POA: Insufficient documentation

## 2024-04-14 DIAGNOSIS — I77810 Thoracic aortic ectasia: Secondary | ICD-10-CM | POA: Insufficient documentation

## 2024-04-14 DIAGNOSIS — I358 Other nonrheumatic aortic valve disorders: Secondary | ICD-10-CM | POA: Diagnosis not present

## 2024-04-14 DIAGNOSIS — I10 Essential (primary) hypertension: Secondary | ICD-10-CM | POA: Diagnosis not present

## 2024-04-14 DIAGNOSIS — Z8249 Family history of ischemic heart disease and other diseases of the circulatory system: Secondary | ICD-10-CM | POA: Insufficient documentation

## 2024-04-14 DIAGNOSIS — I351 Nonrheumatic aortic (valve) insufficiency: Secondary | ICD-10-CM | POA: Diagnosis not present

## 2024-04-14 LAB — ECHOCARDIOGRAM COMPLETE
Area-P 1/2: 3.3 cm2
S' Lateral: 3.31 cm

## 2024-05-11 ENCOUNTER — Ambulatory Visit

## 2024-05-13 ENCOUNTER — Inpatient Hospital Stay: Admission: RE | Admit: 2024-05-13 | Discharge: 2024-05-13 | Attending: Internal Medicine | Admitting: Internal Medicine

## 2024-05-13 DIAGNOSIS — Z1231 Encounter for screening mammogram for malignant neoplasm of breast: Secondary | ICD-10-CM
